# Patient Record
Sex: Male | Born: 1955 | Race: White | Hispanic: No | Marital: Single | Smoking: Current every day smoker
Health system: Southern US, Community
[De-identification: ages and names within clinical notes are randomized; demographics above are authoritative.]

## PROBLEM LIST (undated history)

## (undated) HISTORY — PX: FRACTURE SURGERY: SHX138

## (undated) HISTORY — PX: SKIN CANCER EXCISION: SHX779

---

## 1999-12-03 ENCOUNTER — Encounter: Payer: Self-pay | Admitting: Emergency Medicine

## 1999-12-03 ENCOUNTER — Emergency Department (HOSPITAL_COMMUNITY): Admission: EM | Admit: 1999-12-03 | Discharge: 1999-12-03 | Payer: Self-pay | Admitting: *Deleted

## 2000-03-24 ENCOUNTER — Ambulatory Visit (HOSPITAL_BASED_OUTPATIENT_CLINIC_OR_DEPARTMENT_OTHER): Admission: RE | Admit: 2000-03-24 | Discharge: 2000-03-24 | Payer: Self-pay | Admitting: Surgery

## 2003-01-14 ENCOUNTER — Emergency Department (HOSPITAL_COMMUNITY): Admission: EM | Admit: 2003-01-14 | Discharge: 2003-01-14 | Payer: Self-pay | Admitting: Emergency Medicine

## 2004-08-01 ENCOUNTER — Ambulatory Visit (HOSPITAL_COMMUNITY): Admission: RE | Admit: 2004-08-01 | Discharge: 2004-08-01 | Payer: Self-pay | Admitting: Gastroenterology

## 2004-08-31 ENCOUNTER — Emergency Department (HOSPITAL_COMMUNITY): Admission: EM | Admit: 2004-08-31 | Discharge: 2004-09-01 | Payer: Self-pay | Admitting: Emergency Medicine

## 2008-09-25 ENCOUNTER — Emergency Department (HOSPITAL_COMMUNITY): Admission: EM | Admit: 2008-09-25 | Discharge: 2008-09-25 | Payer: Self-pay | Admitting: *Deleted

## 2008-10-09 ENCOUNTER — Emergency Department (HOSPITAL_COMMUNITY): Admission: EM | Admit: 2008-10-09 | Discharge: 2008-10-09 | Payer: Self-pay | Admitting: Emergency Medicine

## 2010-03-02 IMAGING — CR DG SHOULDER 2+V PORT*R*
2 series · 2 of 2 positions shown · non-contrast
Comparison: Prereduction views 10/09/2008.

CLINICAL DATA: Post reduction shoulder dislocation.

PORTABLE RIGHT SHOULDER - 2+ VIEW

[view not recorded (1 of 2)]
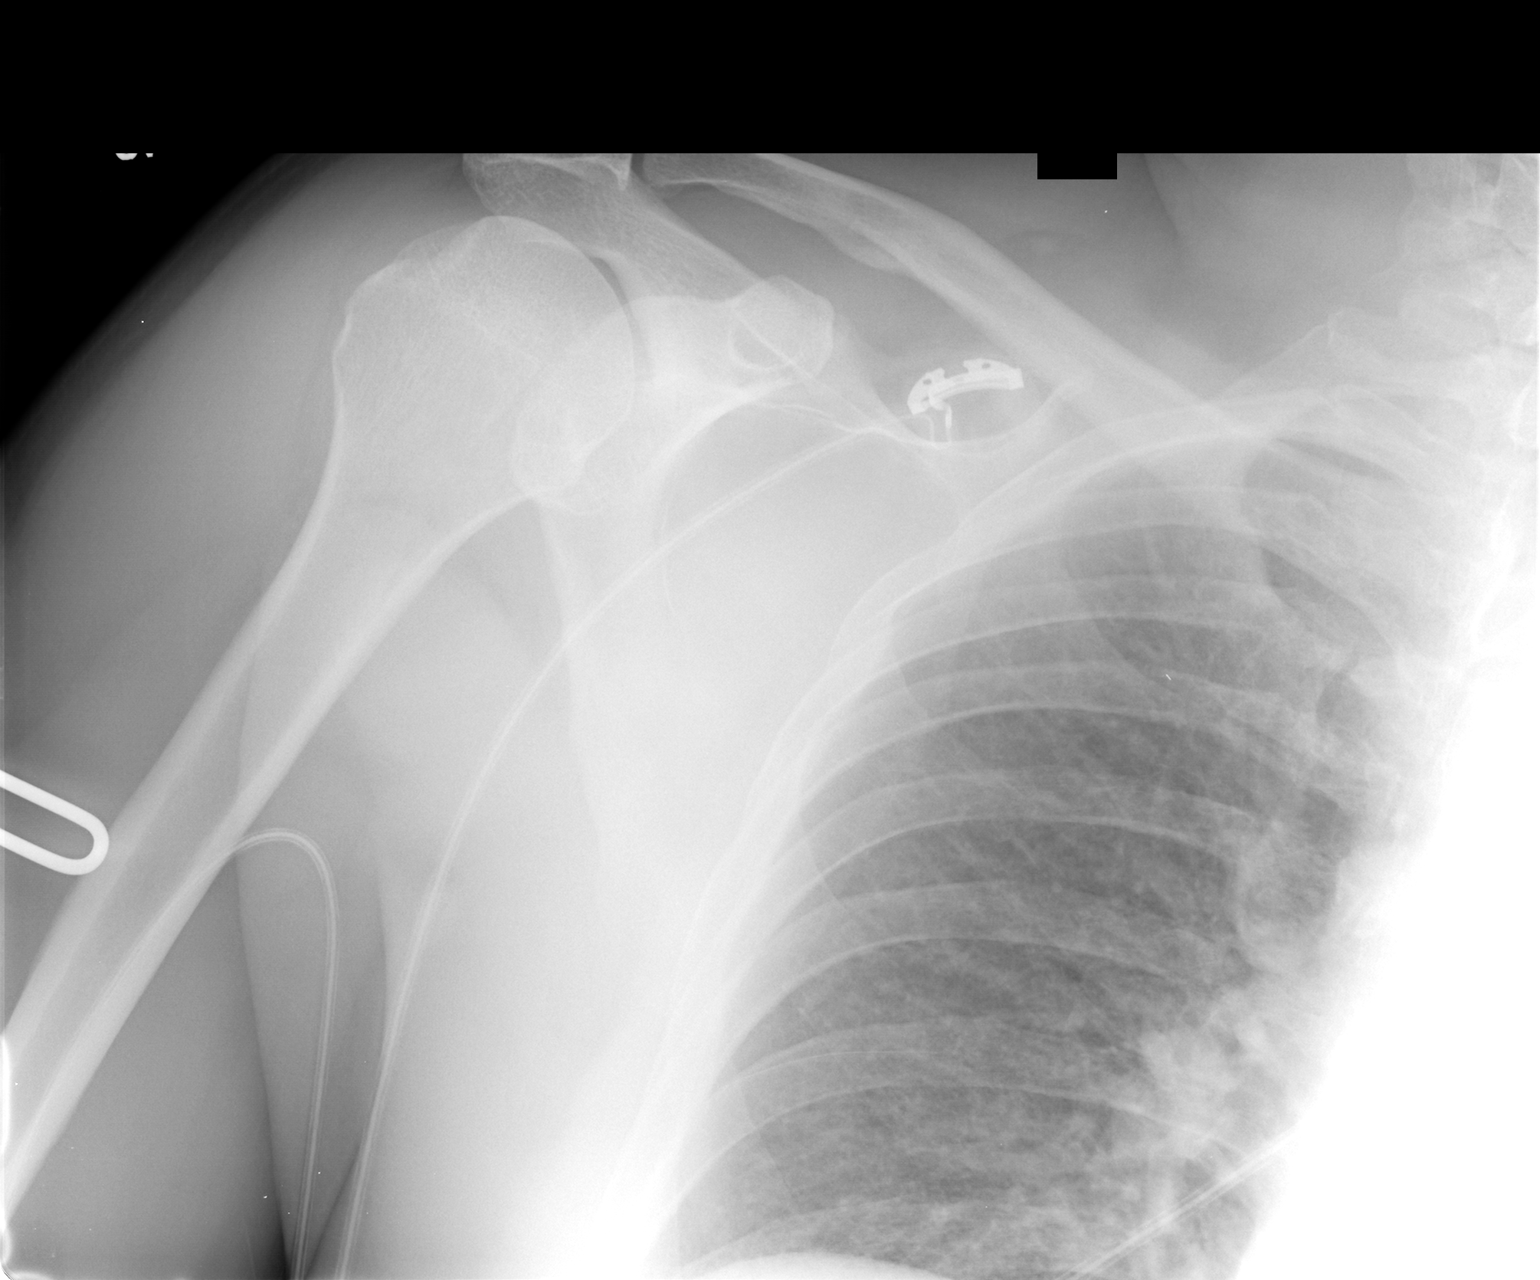

[view not recorded (2 of 2)]
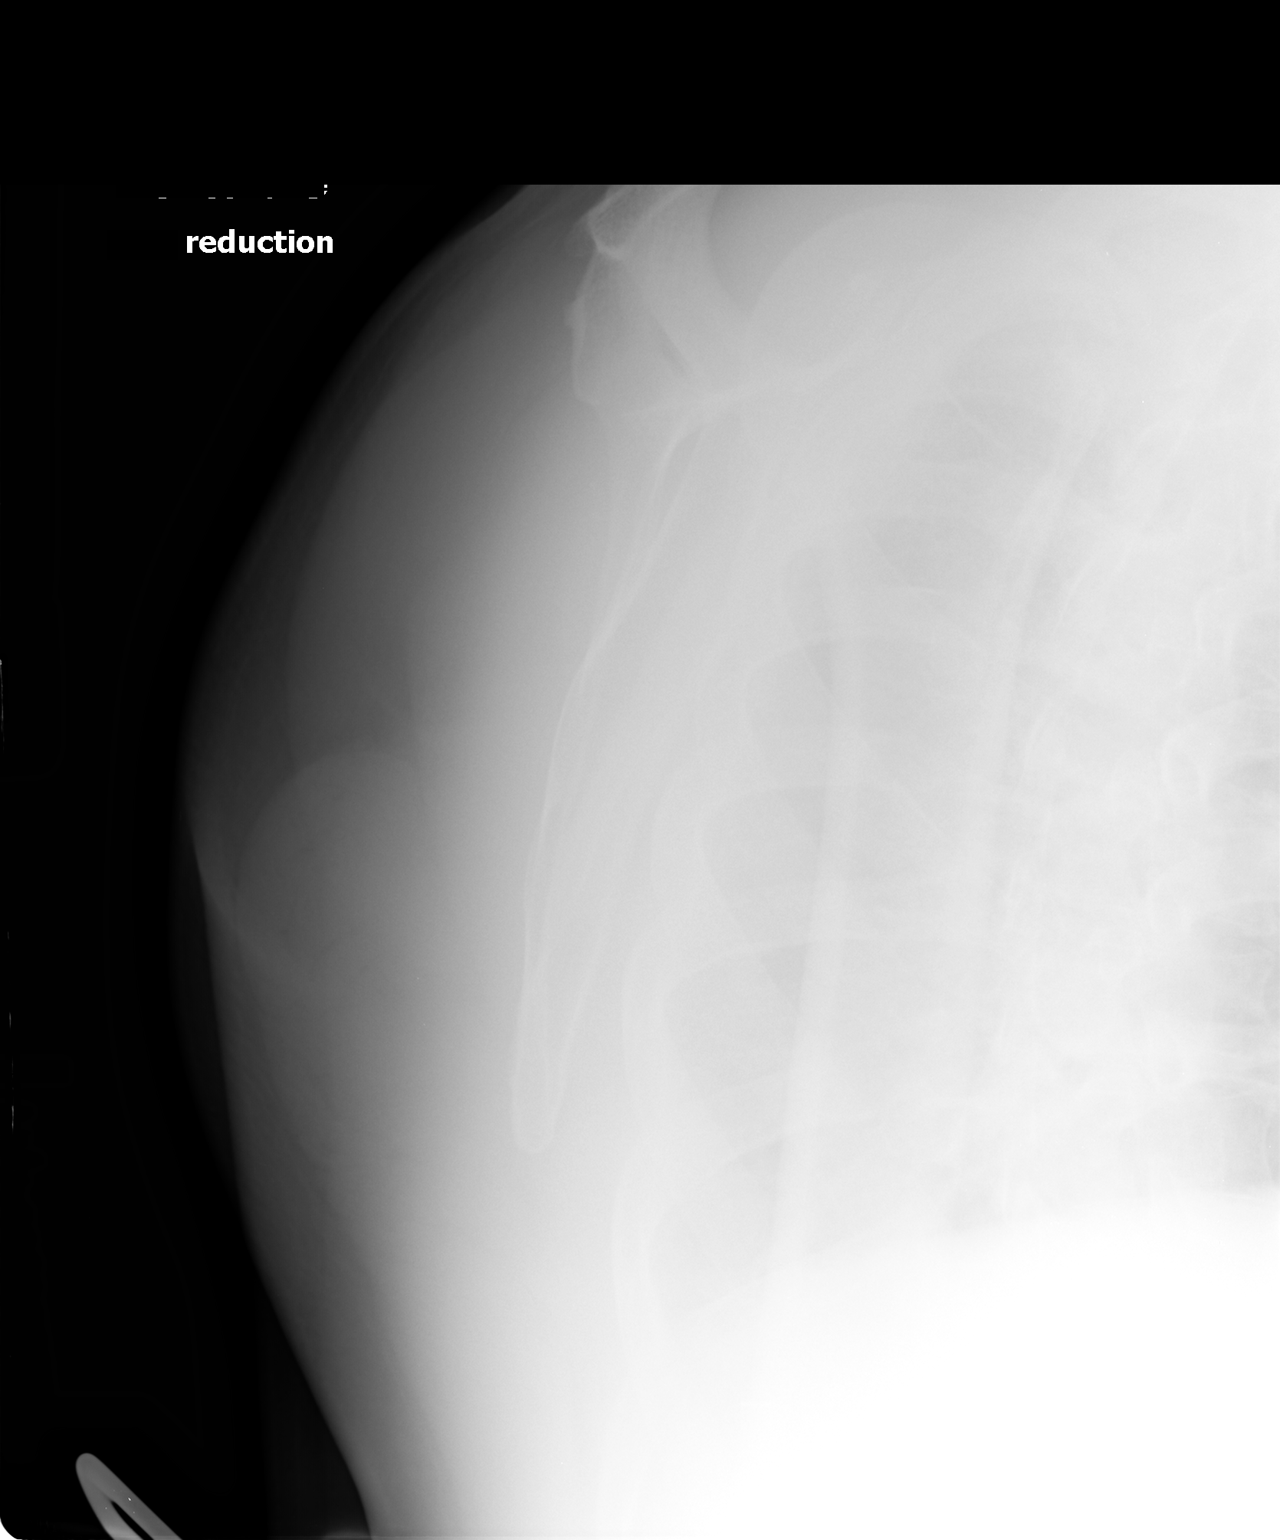

[2 of 2 positions shown; findings below may reference images not displayed]

FINDINGS: Portable Y view is under penetrated.  Reduction right
humeral head.  Impaction fracture lateral aspect humeral head.
Moderate acromioclavicular joint degenerative changes.  Visualized
lungs clear.
IMPRESSION: Reduction right humeral head.  Result of impaction fracture lateral
aspect of the right normal head is noted.

Moderate acromioclavicular joint degenerative changes.

## 2010-03-02 IMAGING — CR DG SHOULDER 2+V*R*
2 series · 2 of 2 positions shown · non-contrast
Comparison: 09/25/2008

CLINICAL DATA: Right shoulder dislocation

RIGHT SHOULDER - 2+ VIEW

[w shoulder ap internal righ]
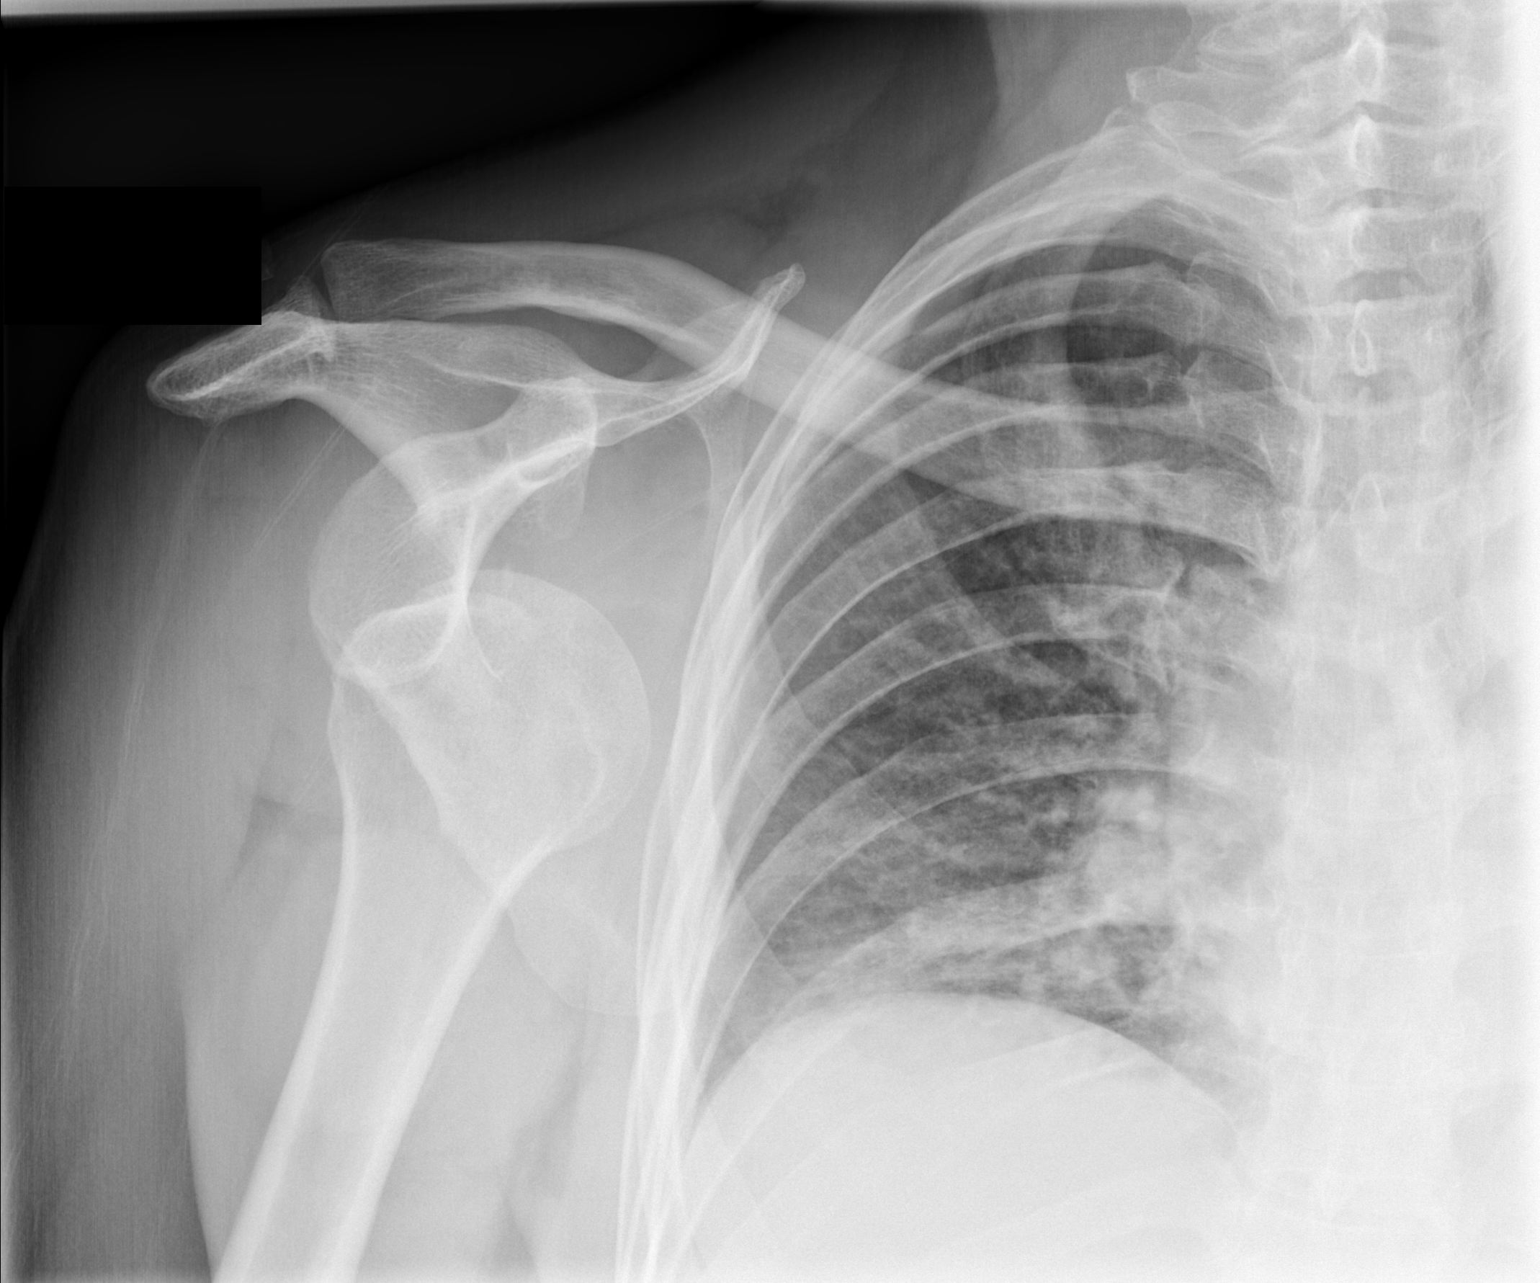

[w shoulder ap external righ]
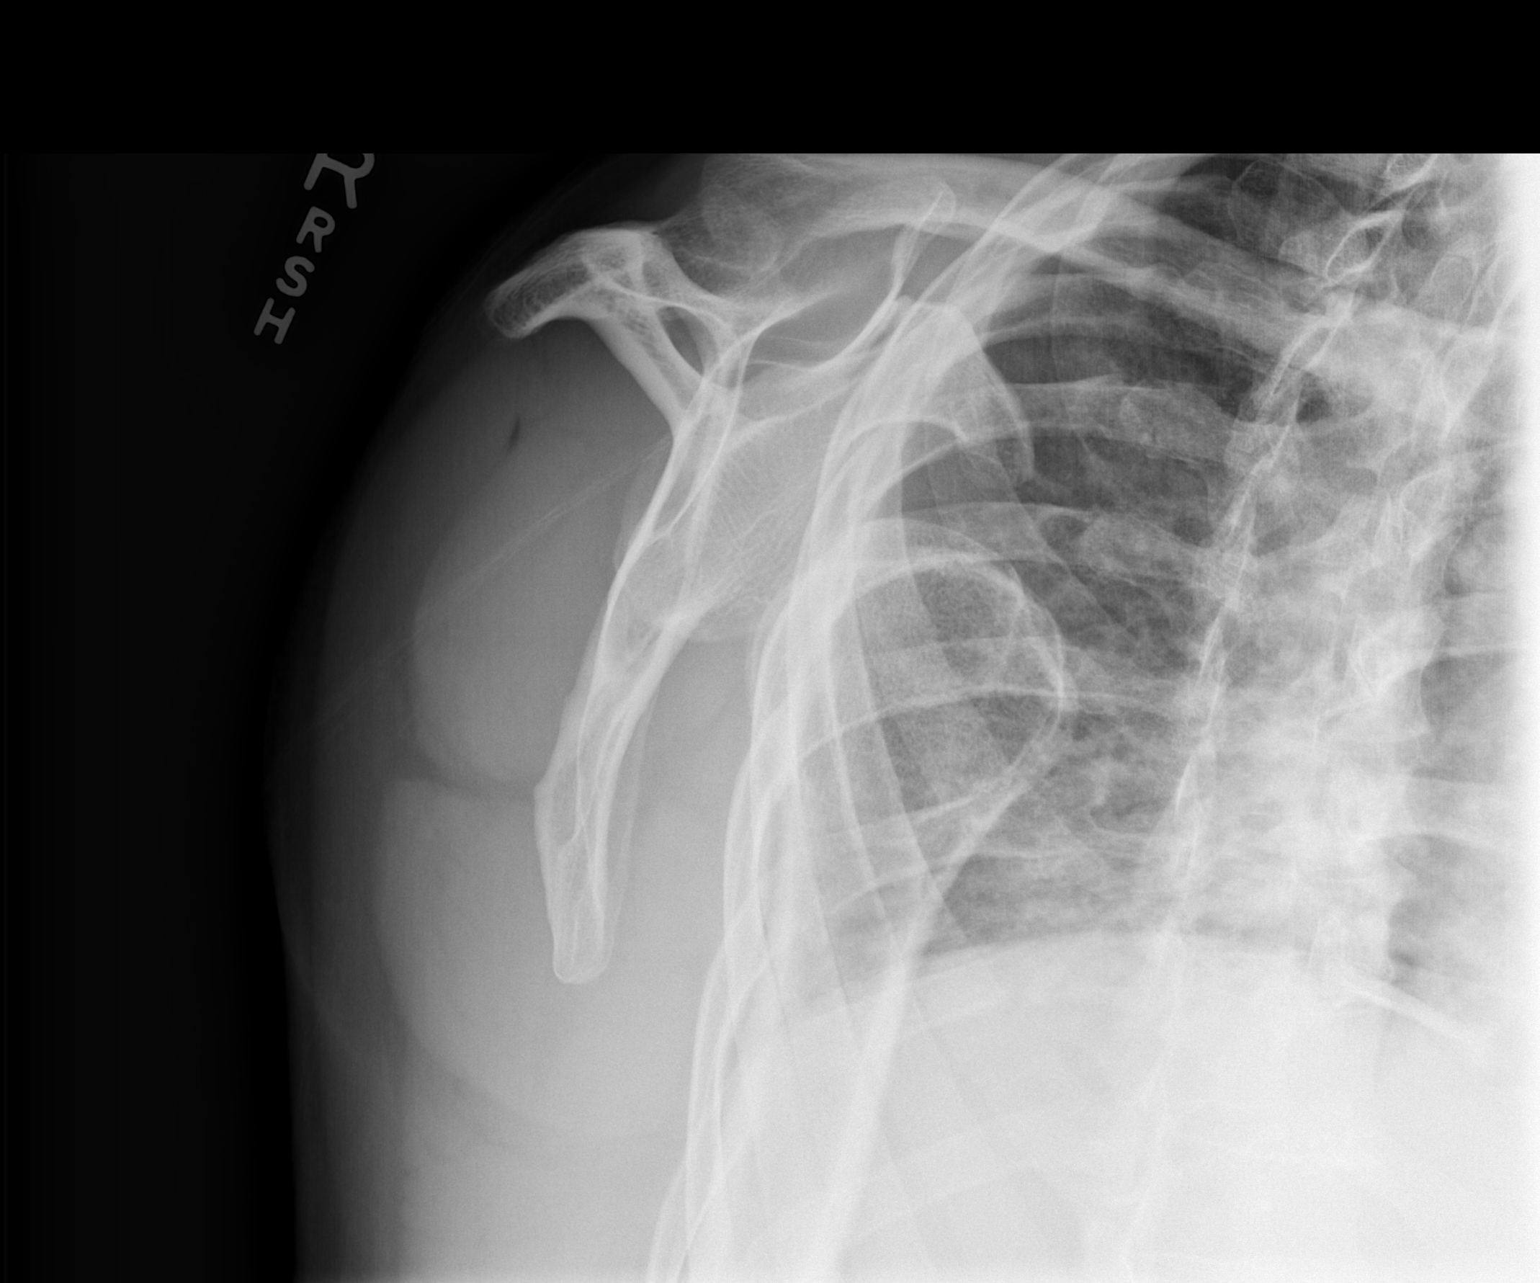

[2 of 2 positions shown; findings below may reference images not displayed]

FINDINGS: There has been an anterior dislocation of the right
shoulder.

No fracture is identified.

No radio-opaque foreign bodies or soft tissue calcifications.

Mild degenerative change affects the acromioclavicular joint.
IMPRESSION: 1.  Anterior shoulder dislocation.

## 2013-02-16 ENCOUNTER — Other Ambulatory Visit: Payer: Self-pay | Admitting: Dermatology

## 2013-11-29 ENCOUNTER — Other Ambulatory Visit: Payer: Self-pay | Admitting: Dermatology

## 2014-06-27 ENCOUNTER — Observation Stay (HOSPITAL_COMMUNITY): Payer: PRIVATE HEALTH INSURANCE

## 2014-06-27 ENCOUNTER — Observation Stay (HOSPITAL_COMMUNITY)
Admission: EM | Admit: 2014-06-27 | Discharge: 2014-06-29 | Disposition: A | Discharge status: Home / Self Care | Payer: PRIVATE HEALTH INSURANCE | Attending: Internal Medicine | Admitting: Internal Medicine

## 2014-06-27 ENCOUNTER — Encounter (HOSPITAL_COMMUNITY): Payer: Self-pay | Admitting: Emergency Medicine

## 2014-06-27 ENCOUNTER — Emergency Department (HOSPITAL_COMMUNITY): Payer: PRIVATE HEALTH INSURANCE

## 2014-06-27 DIAGNOSIS — G459 Transient cerebral ischemic attack, unspecified: Secondary | ICD-10-CM | POA: Diagnosis present

## 2014-06-27 DIAGNOSIS — R202 Paresthesia of skin: Secondary | ICD-10-CM

## 2014-06-27 DIAGNOSIS — R531 Weakness: Secondary | ICD-10-CM | POA: Diagnosis present

## 2014-06-27 DIAGNOSIS — N183 Chronic kidney disease, stage 3 unspecified: Secondary | ICD-10-CM | POA: Diagnosis present

## 2014-06-27 DIAGNOSIS — I739 Peripheral vascular disease, unspecified: Secondary | ICD-10-CM | POA: Insufficient documentation

## 2014-06-27 DIAGNOSIS — F17210 Nicotine dependence, cigarettes, uncomplicated: Secondary | ICD-10-CM | POA: Insufficient documentation

## 2014-06-27 DIAGNOSIS — I639 Cerebral infarction, unspecified: Secondary | ICD-10-CM | POA: Diagnosis not present

## 2014-06-27 DIAGNOSIS — Z7982 Long term (current) use of aspirin: Secondary | ICD-10-CM | POA: Diagnosis not present

## 2014-06-27 DIAGNOSIS — E785 Hyperlipidemia, unspecified: Secondary | ICD-10-CM | POA: Diagnosis present

## 2014-06-27 DIAGNOSIS — I672 Cerebral atherosclerosis: Secondary | ICD-10-CM | POA: Diagnosis not present

## 2014-06-27 DIAGNOSIS — Z85828 Personal history of other malignant neoplasm of skin: Secondary | ICD-10-CM | POA: Diagnosis not present

## 2014-06-27 DIAGNOSIS — Z789 Other specified health status: Secondary | ICD-10-CM | POA: Diagnosis present

## 2014-06-27 DIAGNOSIS — Z720 Tobacco use: Secondary | ICD-10-CM | POA: Diagnosis present

## 2014-06-27 DIAGNOSIS — I519 Heart disease, unspecified: Secondary | ICD-10-CM | POA: Diagnosis present

## 2014-06-27 DIAGNOSIS — F1099 Alcohol use, unspecified with unspecified alcohol-induced disorder: Secondary | ICD-10-CM

## 2014-06-27 DIAGNOSIS — Z7289 Other problems related to lifestyle: Secondary | ICD-10-CM | POA: Diagnosis present

## 2014-06-27 DIAGNOSIS — R200 Anesthesia of skin: Secondary | ICD-10-CM | POA: Diagnosis present

## 2014-06-27 LAB — COMPREHENSIVE METABOLIC PANEL
ALBUMIN: 4.1 g/dL (ref 3.5–5.2)
ALK PHOS: 71 U/L (ref 39–117)
ALT: 13 U/L (ref 0–53)
ANION GAP: 9 (ref 5–15)
AST: 15 U/L (ref 0–37)
BILIRUBIN TOTAL: 0.6 mg/dL (ref 0.3–1.2)
BUN: 15 mg/dL (ref 6–23)
CALCIUM: 8.8 mg/dL (ref 8.4–10.5)
CHLORIDE: 105 mmol/L (ref 96–112)
CO2: 24 mmol/L (ref 19–32)
CREATININE: 1.33 mg/dL (ref 0.50–1.35)
GFR, EST AFRICAN AMERICAN: 66 mL/min — AB (ref >90)
GFR, EST NON AFRICAN AMERICAN: 57 mL/min — AB (ref >90)
Glucose, Bld: 120 mg/dL — ABNORMAL HIGH (ref 70–99)
POTASSIUM: 3.8 mmol/L (ref 3.5–5.1)
Sodium: 138 mmol/L (ref 135–145)
Total Protein: 7.4 g/dL (ref 6.0–8.3)

## 2014-06-27 LAB — DIFFERENTIAL
Basophils Absolute: 0.1 10*3/uL (ref 0.0–0.1)
Basophils Relative: 1 % (ref 0–1)
EOS ABS: 0.1 10*3/uL (ref 0.0–0.7)
Eosinophils Relative: 2 % (ref 0–5)
LYMPHS ABS: 2.2 10*3/uL (ref 0.7–4.0)
LYMPHS PCT: 26 % (ref 12–46)
MONO ABS: 0.7 10*3/uL (ref 0.1–1.0)
Monocytes Relative: 9 % (ref 3–12)
NEUTROS PCT: 63 % (ref 43–77)
Neutro Abs: 5.2 10*3/uL (ref 1.7–7.7)

## 2014-06-27 LAB — I-STAT TROPONIN, ED: TROPONIN I, POC: 0 ng/mL (ref 0.00–0.08)

## 2014-06-27 LAB — I-STAT CHEM 8, ED
BUN: 15 mg/dL (ref 6–23)
CALCIUM ION: 1.05 mmol/L — AB (ref 1.13–1.30)
CREATININE: 1.3 mg/dL (ref 0.50–1.35)
Chloride: 105 mmol/L (ref 96–112)
GLUCOSE: 120 mg/dL — AB (ref 70–99)
HEMATOCRIT: 47 % (ref 39.0–52.0)
Hemoglobin: 16 g/dL (ref 13.0–17.0)
POTASSIUM: 3.8 mmol/L (ref 3.5–5.1)
SODIUM: 140 mmol/L (ref 135–145)
TCO2: 23 mmol/L (ref 0–100)

## 2014-06-27 LAB — PROTIME-INR
INR: 0.99 (ref 0.00–1.49)
Prothrombin Time: 13.2 seconds (ref 11.6–15.2)

## 2014-06-27 LAB — CBC
HCT: 42.3 % (ref 39.0–52.0)
Hemoglobin: 14.7 g/dL (ref 13.0–17.0)
MCH: 33.1 pg (ref 26.0–34.0)
MCHC: 34.8 g/dL (ref 30.0–36.0)
MCV: 95.3 fL (ref 78.0–100.0)
PLATELETS: 293 10*3/uL (ref 150–400)
RBC: 4.44 MIL/uL (ref 4.22–5.81)
RDW: 13.3 % (ref 11.5–15.5)
WBC: 8.3 10*3/uL (ref 4.0–10.5)

## 2014-06-27 LAB — CBG MONITORING, ED
Glucose-Capillary: 128 mg/dL — ABNORMAL HIGH (ref 70–99)
Glucose-Capillary: 105 mg/dL — ABNORMAL HIGH (ref 70–99)

## 2014-06-27 LAB — APTT: APTT: 37 s (ref 24–37)

## 2014-06-27 MED ORDER — ENOXAPARIN SODIUM 40 MG/0.4ML ~~LOC~~ SOLN
40.0000 mg | SUBCUTANEOUS | Status: DC
  Filled 2014-06-27 (×2): qty 0.4

## 2014-06-27 MED ORDER — ACETAMINOPHEN 650 MG RE SUPP: 650.0000 mg | RECTAL | Status: DC | PRN

## 2014-06-27 MED ORDER — ZOLPIDEM TARTRATE 5 MG PO TABS
5.0000 mg | ORAL_TABLET | Freq: Once | ORAL | Status: AC
  Administered 2014-06-27: 5 mg via ORAL
  Filled 2014-06-27: qty 1

## 2014-06-27 MED ORDER — ACETAMINOPHEN 325 MG PO TABS: 650.0000 mg | ORAL_TABLET | ORAL | Status: DC | PRN

## 2014-06-27 MED ORDER — STROKE: EARLY STAGES OF RECOVERY BOOK
Freq: Once | Status: DC
  Filled 2014-06-27: qty 1

## 2014-06-27 MED ORDER — ASPIRIN 81 MG PO CHEW
81.0000 mg | CHEWABLE_TABLET | Freq: Once | ORAL | Status: AC
  Administered 2014-06-27: 81 mg via ORAL
  Filled 2014-06-27: qty 1

## 2014-06-27 MED ORDER — SENNOSIDES-DOCUSATE SODIUM 8.6-50 MG PO TABS: 1.0000 | ORAL_TABLET | Freq: Every evening | ORAL | Status: DC | PRN

## 2014-06-27 MED ORDER — ASPIRIN 325 MG PO TABS
325.0000 mg | ORAL_TABLET | Freq: Every day | ORAL | Status: DC
  Administered 2014-06-28 – 2014-06-29 (×2): 325 mg via ORAL
  Filled 2014-06-27 (×2): qty 1

## 2014-06-27 NOTE — ED Provider Notes (Signed)
CSN: [id number]     Arrival date & time 06/27/14  1412 History   First MD Initiated Contact with Patient 06/27/14 1427     Chief Complaint  Patient presents with  . Code Stroke      HPI Pt was seen at 1425.  Per pt, c/o sudden onset and persistence of constant LUE weakness that began today approximately 1 hour PTA (1330/1345). Pt states he was sitting at work looking out a window when he noticed his vision was "blurry." States the then noted his left hand to be "numb" and his LUE feel "weaker" than his right. Denies slurred speech, no facial droop, no ataxia, no headache, no neck pain, no syncope/near syncope, no CP/SOB.    History reviewed. No pertinent past medical history.   Past Surgical History  Procedure Laterality Date  . Fracture surgery Left     Forearm  . Skin cancer excision N/A     Mid forehead   History reviewed. No pertinent family history. History  Substance Use Topics  . Smoking status: Current Every Day Smoker -- 0.75 packs/day    Types: Cigarettes  . Smokeless tobacco: Not on file  . Alcohol Use: 3.6 oz/week    6 Cans of beer per week    Review of Systems ROS: Statement: All systems negative except as marked or noted in the HPI; Constitutional: Negative for fever and chills. ; ; Eyes: +"blurry vision." Negative for eye pain, redness and discharge. ; ; ENMT: Negative for ear pain, hoarseness, nasal congestion, sinus pressure and sore throat. ; ; Cardiovascular: Negative for chest pain, palpitations, diaphoresis, dyspnea and peripheral edema. ; ; Respiratory: Negative for cough, wheezing and stridor. ; ; Gastrointestinal: Negative for nausea, vomiting, diarrhea, abdominal pain, blood in stool, hematemesis, jaundice and rectal bleeding. . ; ; Genitourinary: Negative for dysuria, flank pain and hematuria. ; ; Musculoskeletal: Negative for back pain and neck pain. Negative for swelling and trauma.; ; Skin: Negative for pruritus, rash, abrasions, blisters, bruising and  skin lesion.; ; Neuro: +extremity weakness, paresthesia. Negative for headache, lightheadedness and neck stiffness. Negative for weakness, altered level of consciousness , altered mental status, involuntary movement, seizure and syncope.      Allergies  Review of patient's allergies indicates no known allergies.  Home Medications   Prior to Admission medications   Medication Sig Start Date End Date Taking? Authorizing Provider  Esomeprazole Magnesium (NEXIUM PO) Take 1 capsule by mouth daily as needed (heartburn).   Yes Historical Provider, MD   BP 131/83 mmHg  Pulse 71  Temp(Src) 98.5 F (36.9 C) (Oral)  Resp 20  SpO2 97% Physical Exam  1425: Physical examination:  Nursing notes reviewed; Vital signs and O2 SAT reviewed;  Constitutional: Well developed, Well nourished, Well hydrated, In no acute distress; Head:  Normocephalic, atraumatic; Eyes: EOMI, PERRL, No scleral icterus; ENMT: Mouth and pharynx normal, Mucous membranes moist; Neck: Supple, Full range of motion, No lymphadenopathy; Cardiovascular: Regular rate and rhythm, No gallop; Respiratory: Breath sounds clear & equal bilaterally, No wheezes.  Speaking full sentences with ease, Normal respiratory effort/excursion; Chest: Nontender, Movement normal; Abdomen: Soft, Nontender, Nondistended, Normal bowel sounds; Genitourinary: No CVA tenderness; Extremities: Pulses normal, No tenderness, No edema, No calf edema or asymmetry.; Neuro: AA&Ox3, Major CN grossly intact. Speech clear.  No facial droop.  No nystagmus. No gross visual field cuts. Left grip weaker than right. Strength 5/5 RUE and bilat LE's; strength RUE 4/5.  DTR 2/4 equal bilat UE's and LE's.  +subjective  decreased sensation left face, LUE and LLE, otherwise no gross sensory deficits.  Normal cerebellar testing bilat UE's (finger-nose) and LE's (heel-shin)..; Skin: Color normal, Warm, Dry.   ED Course  Procedures     EKG Interpretation   Date/Time:  Wednesday June 27 2014 14:21:04 EST Ventricular Rate:  75 PR Interval:  165 QRS Duration: 67 QT Interval:  363 QTC Calculation: 405 R Axis:   30 Text Interpretation:  Sinus rhythm Probable left atrial enlargement  Baseline wander No old tracing to compare Confirmed by [facility]  MD,  Nicholos Johns [phone]) on 06/27/2014 3:55:48 PM      MDM  MDM Reviewed: previous chart, nursing note and vitals Reviewed previous: labs Interpretation: labs, ECG, x-ray and CT scan Total time providing critical care: 30-74 minutes. This excludes time spent performing separately reportable procedures and services. Consults: neurology and admitting MD     CRITICAL CARE Performed by: Laray Anger Total critical care time: 35 Critical care time was exclusive of separately billable procedures and treating other patients. Critical care was necessary to treat or prevent imminent or life-threatening deterioration. Critical care was time spent personally by me on the following activities: development of treatment plan with patient and/or surrogate as well as nursing, discussions with consultants, evaluation of patient's response to treatment, examination of patient, obtaining history from patient or surrogate, ordering and performing treatments and interventions, ordering and review of laboratory studies, ordering and review of radiographic studies, pulse oximetry and re-evaluation of patient's condition.   Results for orders placed or performed during the hospital encounter of 06/27/14  Protime-INR  Result Value Ref Range   Prothrombin Time 13.2 11.6 - 15.2 seconds   INR 0.99 0.00 - 1.49  APTT  Result Value Ref Range   aPTT 37 24 - 37 seconds  CBC  Result Value Ref Range   WBC 8.3 4.0 - 10.5 K/uL   RBC 4.44 4.22 - 5.81 MIL/uL   Hemoglobin 14.7 13.0 - 17.0 g/dL   HCT 65.7 84.6 - 96.2 %   MCV 95.3 78.0 - 100.0 fL   MCH 33.1 26.0 - 34.0 pg   MCHC 34.8 30.0 - 36.0 g/dL   RDW 95.2 84.1 - 32.4 %   Platelets 293 150 -  400 K/uL  Differential  Result Value Ref Range   Neutrophils Relative % 63 43 - 77 %   Neutro Abs 5.2 1.7 - 7.7 K/uL   Lymphocytes Relative 26 12 - 46 %   Lymphs Abs 2.2 0.7 - 4.0 K/uL   Monocytes Relative 9 3 - 12 %   Monocytes Absolute 0.7 0.1 - 1.0 K/uL   Eosinophils Relative 2 0 - 5 %   Eosinophils Absolute 0.1 0.0 - 0.7 K/uL   Basophils Relative 1 0 - 1 %   Basophils Absolute 0.1 0.0 - 0.1 K/uL  Comprehensive metabolic panel  Result Value Ref Range   Sodium 138 135 - 145 mmol/L   Potassium 3.8 3.5 - 5.1 mmol/L   Chloride 105 96 - 112 mmol/L   CO2 24 19 - 32 mmol/L   Glucose, Bld 120 (H) 70 - 99 mg/dL   BUN 15 6 - 23 mg/dL   Creatinine, Ser 4.01 0.50 - 1.35 mg/dL   Calcium 8.8 8.4 - 02.7 mg/dL   Total Protein 7.4 6.0 - 8.3 g/dL   Albumin 4.1 3.5 - 5.2 g/dL   AST 15 0 - 37 U/L   ALT 13 0 - 53 U/L   Alkaline Phosphatase 71  39 - 117 U/L   Total Bilirubin 0.6 0.3 - 1.2 mg/dL   GFR calc non Af Amer 57 (L) >90 mL/min   GFR calc Af Amer 66 (L) >90 mL/min   Anion gap 9 5 - 15  CBG monitoring, ED  Result Value Ref Range   Glucose-Capillary 128 (H) 70 - 99 mg/dL  I-stat troponin, ED ([facility])  Result Value Ref Range   Troponin i, poc 0.00 0.00 - 0.08 ng/mL   Comment 3          I-stat Chem 8, ED  Result Value Ref Range   Sodium 140 135 - 145 mmol/L   Potassium 3.8 3.5 - 5.1 mmol/L   Chloride 105 96 - 112 mmol/L   BUN 15 6 - 23 mg/dL   Creatinine, Ser 1.611.30 0.50 - 1.35 mg/dL   Glucose, Bld 096120 (H) 70 - 99 mg/dL   Calcium, Ion 0.451.05 (L) 1.13 - 1.30 mmol/L   TCO2 23 0 - 100 mmol/L   Hemoglobin 16.0 13.0 - 17.0 g/dL   HCT 40.947.0 81.139.0 - 91.452.0 %   Ct Head (brain) Wo Contrast 06/27/2014   CLINICAL DATA:  Left arm numbness, frontal headache, blurred position  EXAM: CT HEAD WITHOUT CONTRAST  TECHNIQUE: Contiguous axial images were obtained from the base of the skull through the vertex without intravenous contrast.  COMPARISON:  None.  FINDINGS: No skull fracture is noted. There is  mucosal thickening bilateral maxillary sinus. The mastoid air cells are unremarkable. Mild mucosal thickening in posterior aspect of left sphenoid sinus.  No intracranial hemorrhage, mass effect or midline shift  No acute infarction. No mass lesion is noted on this unenhanced scan no hydrocephalus.  IMPRESSION: No acute intracranial abnormality. Mucosal thickening bilateral maxillary sinus.   Electronically Signed   By: Natasha MeadLiviu  Pop M.D.   On: 06/27/2014 14:56    1440:  Code Stroke called on pt's arrival to exam room. T/C to Neurology Dr. Amada JupiterKirkpatrick, case discussed, including:  HPI, pertinent PM/SHx, VS/PE, dx testing, ED course and treatment:  States Dr. Roseanne RenoStewart will come to ED for evaluation.  1500:  Neuro Dr. Roseanne RenoStewart has evaluated pt: believes pt has had a small acute stroke with mild deficits, he is not a TPA candidate, requests to dose ASA and [facility].   1600:  No change in assessment. [facility]. Waymon AmatoHongalgi, case discussed, including:  HPI, pertinent PM/SHx, VS/PE, dx testing, ED course and treatment:  [facility], requests to write temporary orders, obtain MRI brain and observation tele bed to team MC[facility]mits.   Samuel JesterKathleen Kalisha Keadle, DO 06/30/14 [phone]

## 2014-06-27 NOTE — ED Notes (Signed)
Per Victorino DikeJennifer CN neurologist at bedside.

## 2014-06-27 NOTE — Consult Note (Signed)
Referring Physician: [facility], [facility]    Chief Complaint: Acute onset of numbness and weakness involving left upper extremity.  HPI: AZARIAN STARACE is an 59 y.o. male with no documented previous medical history presenting with new onset numbness and distal weakness of his left upper extremity. He was last known well at 1345 today. He has not been on antiplatelet therapy. CT scan of his head showed no acute intracranial abnormality. Strength of his left upper extremity improved. Numbness has persisted. No changes in speech were noted. He had no weakness nor sensory changes involving left lower extremity. NIH stroke score was 2.  LSN: [id number] on 06/27/2014 tPA Given: No: Improving symptoms with minimal residual deficits mRankin:  History reviewed. No pertinent past medical history.  Family history; negative for stroke  Medications: I have reviewed the patient's current medications.  ROS: History obtained from the patient  General ROS: negative for - chills, fatigue, fever, night sweats, weight gain or weight loss Psychological ROS: negative for - behavioral disorder, hallucinations, memory difficulties, mood swings or suicidal ideation Ophthalmic ROS: negative for - blurry vision, double vision, eye pain or loss of vision ENT ROS: negative for - epistaxis, nasal discharge, oral lesions, sore throat, tinnitus or vertigo Allergy and Immunology ROS: negative for - hives or itchy/watery eyes Hematological and Lymphatic ROS: negative for - bleeding problems, bruising or swollen lymph nodes Endocrine ROS: negative for - galactorrhea, hair pattern changes, polydipsia/polyuria or temperature intolerance Respiratory ROS: negative for - cough, hemoptysis, shortness of breath or wheezing Cardiovascular ROS: negative for - chest pain, dyspnea on exertion, edema or irregular heartbeat Gastrointestinal ROS: negative for - abdominal pain, diarrhea, hematemesis, nausea/vomiting or stool  incontinence Genito-Urinary ROS: negative for - dysuria, hematuria, incontinence or urinary frequency/urgency Musculoskeletal ROS: negative for - joint swelling or muscular weakness Neurological ROS: as noted in HPI Dermatological ROS: negative for rash and skin lesion changes  Physical Examination: Blood pressure 125/76, pulse 72, temperature 98.5 F (36.9 C), temperature source Oral, resp. rate 20, SpO2 97 %. Appearance was that of late middle-aged man of medium build who was alert and in no acute distress. HEENT: Normal Neck: Supple with no tenderness and full range of motion Normal heart rate and rhythm; normal heart sounds. Extremities were normal in appearance with no edema and no discoloration. Neurologic Examination: Mental Status: Alert, oriented, thought content appropriate.  Speech fluent without evidence of aphasia. Able to follow commands without difficulty. Cranial Nerves: II-Visual fields were normal. III/IV/VI-Pupils were equal and reacted. Extraocular movements were full and conjugate.    V/VII-reduced perception of tactile sensation over left side of the face compared to the right; equivocal flattening of left nasolabial fold.[name] VIII-normal. X-normal speech. XII-midline tongue extension Motor: Normal strength throughout except for equivocal mild weakness of intrinsic muscles and hand grip on the left compared to the right. Sensory: Reduced perception tactile sensation over upper extremity compared to right upper extremity, distally greater than proximally; no lower extremity sensory abnormalities. Deep Tendon Reflexes: 2+ and symmetric. Plantars: Flexor bilaterally Cerebellar: Normal finger-to-nose testing. Carotid auscultation: Normal  Ct Head (brain) Wo Contrast  06/27/2014   CLINICAL DATA:  Left arm numbness, frontal headache, blurred position  EXAM: CT HEAD WITHOUT CONTRAST  TECHNIQUE: Contiguous axial images were obtained from the base of the skull through the  vertex without intravenous contrast.  COMPARISON:  None.  FINDINGS: No skull fracture is noted. There is mucosal thickening bilateral maxillary sinus. The mastoid air cells are unremarkable. Mild mucosal thickening in posterior  aspect of left sphenoid sinus.  No intracranial hemorrhage, mass effect or midline shift  No acute infarction. No mass lesion is noted on this unenhanced scan no hydrocephalus.  IMPRESSION: No acute intracranial abnormality. Mucosal thickening bilateral maxillary sinus.   Electronically Signed   By: Natasha MeadLiviu  Pop M.D.   On: 06/27/2014 14:56    Assessment: 59 y.o. 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  Stroke Risk Factors - none  Plan: 1. HgbA1c, fasting lipid panel 2. MRI, MRA  of the brain without contrast 3. PT consult, OT consult, Speech consult 4. Echocardiogram 5. Carotid dopplers 6. Prophylactic therapy-Antiplatelet med: Aspirin  7. Risk factor modification 8. Telemetry monitoring   C.R. Roseanne RenoStewart, MD [facility] [phone]  06/27/2014, 3:25 PM

## 2014-06-27 NOTE — ED Notes (Signed)
Pt return from MRI and denies ALL symptoms. Pt neuro assessment unremarkable at present time.

## 2014-06-27 NOTE — ED Notes (Addendum)
Pt reports sudden onset of left hand numbness and bilateral blurry vision at 1345 today.

## 2014-06-27 NOTE — ED Notes (Signed)
Pt transported to MRI. Will call [facility] with pt return.

## 2014-06-27 NOTE — ED Notes (Signed)
Report to [facility]-on way to get patient 

## 2014-06-27 NOTE — H&P (Addendum)
History and Physical  Christian Wilcox RUE:[id number] DOB: 1955/07/01 DOA: 06/27/2014  Referring physician: Dr. Samuel Jester PCP: No primary care provider on file. PCP at [facility] family practice-patient does not recollect name. Outpatient Specialists:  1. None  Chief Complaint: Left upper extremity weakness and numbness.  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 He was in his usual state of health until 1:45 PM on 06/27/13 when he was sitting in his office and noted sudden onset of left upper extremity weakness, left hand tingling and numbness. He stood up and noticed transient blurred vision which lasted less than a minute and resolved spontaneously. No reported headache or slurred speech. No diplopia. His colleagues did not notice any facial asymmetry. Since then he has had couple of episodes of transient blurred vision which resolved spontaneously. His left upper extremity symptoms have improved but persistent mildly. He denies any complaints in the left lower extremity. CT head negative for acute findings. Neurology has evaluated and recommend transferring to [facility] for further stroke workup and evaluation by stroke team in a.m. Hospitalist admission was requested.   Review of Systems: All systems reviewed and apart from history of presenting illness, are negative.  History reviewed. No pertinent past medical history. Past Surgical History  Procedure Laterality Date  . Fracture surgery Left     Forearm  . Skin cancer excision N/A     Mid forehead   Social History:  reports that he has been smoking Cigarettes.  He has been smoking about 0.75 packs per day. He does not have any smokeless tobacco history on file. He reports that he drinks  about 3.6 oz of alcohol per week. He reports that he does not use illicit drugs. 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 Independent of activities of daily living. States that he drinks about 6 beers per week and occasionally liquor. Denies drug abuse.  No Known Allergies  History reviewed. No pertinent family history. mother with history of memory loss. Father has history of diabetes. No family history of strokes or heart disease.  Prior to Admission medications   Medication Sig Start Date End Date Taking? Authorizing Provider  Esomeprazole Magnesium (NEXIUM PO) Take 1 capsule by mouth daily as needed (heartburn).   Yes Historical Provider, MD   Physical Exam: Filed Vitals:   06/27/14 1530 06/27/14 1548 06/27/14 1600 06/27/14 1633  BP: 119/75 131/83 136/78 119/88  Pulse: 73 71 70 63  Temp:    98.2 F (36.8 C)  TempSrc:    Oral  Resp: SpO2: 98% 97% 98% 97%   temperature: 98.64F.   General exam: Moderately built and nourished pleasant middle-aged male patient, lying comfortably supine on the gurney in no obvious distress.  Head, eyes and ENT: Nontraumatic and normocephalic. Pupils equally reacting to light and accommodation. Oral mucosa moist.  Neck: Supple. No JVD, carotid bruit or thyromegaly.  Lymphatics: No lymphadenopathy.  Respiratory system: Clear to auscultation. No increased work of breathing.  Cardiovascular system: S1 and S2 heard, RRR. No JVD, murmurs, gallops, clicks or pedal edema.  Gastrointestinal system: Abdomen is nondistended, soft and nontender. Normal bowel sounds heard. No organomegaly or masses appreciated.  Central nervous system: Alert and oriented. No cranial nerve deficits.  Extremities: Symmetric 5  x 5 power. Peripheral pulses symmetrically felt. Patchy mild sensory abnormality in left hand. No pronator drift.  Skin: No rashes or acute findings.  Musculoskeletal system: Negative exam.  Psychiatry: Pleasant and cooperative.   Labs on  Admission:  Basic Metabolic Panel:  Recent Labs Lab 06/27/14 1425 06/27/14 1453  NA 138 140  K 3.8 3.8  CL 105 105  CO2 24  --   GLUCOSE 120* 120*  BUN 15 15  CREATININE 1.33 1.30  CALCIUM 8.8  --    Liver Function Tests:  Recent Labs Lab 06/27/14 1425  AST 15  ALT 13  ALKPHOS 71  BILITOT 0.6  PROT 7.4  ALBUMIN 4.1   No results for input(s): LIPASE, AMYLASE in the last 168 hours. No results for input(s): AMMONIA in the last 168 hours. CBC:  Recent Labs Lab 06/27/14 1425 06/27/14 1453  WBC 8.3  --   NEUTROABS 5.2  --   HGB 14.7 16.0  HCT 42.3 47.0  MCV 95.3  --   PLT 293  --    Cardiac Enzymes: No results for input(s): CKTOTAL, CKMB, CKMBINDEX, TROPONINI in the last 168 hours.  BNP (last 3 results) No results for input(s): PROBNP in the last 8760 hours. CBG:  Recent Labs Lab 06/27/14 1424  GLUCAP 128*    Radiological Exams on Admission: Ct Head (brain) Wo Contrast  06/27/2014   CLINICAL DATA:  Left arm numbness, frontal headache, blurred position  EXAM: CT HEAD WITHOUT CONTRAST  TECHNIQUE: Contiguous axial images were obtained from the base of the skull through the vertex without intravenous contrast.  COMPARISON:  None.  FINDINGS: No skull fracture is noted. There is mucosal thickening bilateral maxillary sinus. The mastoid air cells are unremarkable. Mild mucosal thickening in posterior aspect of left sphenoid sinus.  No intracranial hemorrhage, mass effect or midline shift  No acute infarction. No mass lesion is noted on this unenhanced scan no hydrocephalus.  IMPRESSION: No acute intracranial abnormality. Mucosal thickening bilateral maxillary sinus.   Electronically Signed   By: Natasha MeadLiviu  Pop M.D.   On: 06/27/2014 14:56    EKG: Independently reviewed. Sinus rhythm without acute changes.  Assessment/Plan Principal Problem:   Stroke Active Problems:   Numbness and tingling in left upper extremity   Tobacco abuse   Alcohol use   CKD (chronic kidney  disease), stage III   1. Possible right brain stroke Vs TIA: Complete stroke workup including MRI/MRA brain, 2-D echo, carotid Dopplers, fasting lipids and hemoglobin A1c. Stroke risk factors: Tobacco abuse. Not on antithrombotics PTA. Now on aspirin 325 MG daily for secondary stroke prophylaxis. Tobacco cessation counseled. No TPA secondary to mild symptoms and rapid improvement. Neurology has consulted and recommended [facility]. 2. Tobacco abuse: Cessation counseled. 3. Alcohol use: Monitor CIWA. Advised moderation. 4. Possible Stage III chronic kidney disease: No prior labs to compare.     Code Status: Full  Family Communication: Discussed with patient's brother at bedside- after patient consented.  Disposition Plan: [facility]. DC home when medically ready.   Time spent: 60 minutes  Joya Willmott, MD, FACP, FHM. [facility] Pager [phone]  If 7PM-7AM, please contact night-coverage [email or web address] Password [email or web address] 06/27/2014, 4:41 PM

## 2014-06-27 NOTE — ED Notes (Addendum)
Pt [employer] [facility] is backed up 3-4 hours. EMS to be called to transport pt. Morrie Sheldonshley update and aware transport of neuro status.

## 2014-06-28 DIAGNOSIS — I639 Cerebral infarction, unspecified: Secondary | ICD-10-CM | POA: Insufficient documentation

## 2014-06-28 DIAGNOSIS — G459 Transient cerebral ischemic attack, unspecified: Secondary | ICD-10-CM

## 2014-06-28 DIAGNOSIS — E785 Hyperlipidemia, unspecified: Secondary | ICD-10-CM | POA: Diagnosis present

## 2014-06-28 LAB — LIPID PANEL
Cholesterol: 209 mg/dL — ABNORMAL HIGH (ref 0–200)
HDL: 30 mg/dL — AB (ref >39)
LDL Cholesterol: 129 mg/dL — ABNORMAL HIGH (ref 0–99)
TRIGLYCERIDES: 252 mg/dL — AB (ref <150)
Total CHOL/HDL Ratio: 7 RATIO
VLDL: 50 mg/dL — AB (ref 0–40)

## 2014-06-28 LAB — URINALYSIS, ROUTINE W REFLEX MICROSCOPIC
Bilirubin Urine: NEGATIVE
Glucose, UA: NEGATIVE mg/dL
Hgb urine dipstick: NEGATIVE
KETONES UR: NEGATIVE mg/dL
Leukocytes, UA: NEGATIVE
Nitrite: NEGATIVE
PROTEIN: NEGATIVE mg/dL
SPECIFIC GRAVITY, URINE: 1.022 (ref 1.005–1.030)
Urobilinogen, UA: 0.2 mg/dL (ref 0.0–1.0)
pH: 5.5 (ref 5.0–8.0)

## 2014-06-28 LAB — TSH: TSH: 1.58 u[IU]/mL (ref 0.350–4.500)

## 2014-06-28 LAB — VITAMIN B12: VITAMIN B 12: 514 pg/mL (ref 211–911)

## 2014-06-28 LAB — GLUCOSE, CAPILLARY: Glucose-Capillary: 102 mg/dL — ABNORMAL HIGH (ref 70–99)

## 2014-06-28 MED ORDER — ZOLPIDEM TARTRATE 5 MG PO TABS
5.0000 mg | ORAL_TABLET | Freq: Once | ORAL | Status: AC
  Administered 2014-06-28: 5 mg via ORAL
  Filled 2014-06-28: qty 1

## 2014-06-28 NOTE — Progress Notes (Signed)
[facility] PROGRESS NOTE  Christian Wilcox ZOX:[id number] DOB: Jun 04, 1955 DOA: 06/27/2014 PCP: No primary care provider on file.  Summary I have seen and examined Christian Wilcox at bedside  In the presence of his girlfriend and reviewed his chart. Appreciate Neurology. 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 Workup has been significant for normal MRI brain/carotid Doppler. Lipid panel suggests hyperlipidemia which patient states has been borderline in the last few years and he is not willing to take medications for it at this point. Hemoglobin A1c is pending. Await 2-D echocardiogram. His blood pressure has been occasionally low in the 90s systolic, I wonder if this is anything to do with his presentation. Also requested TSH/vitamin B12 level/RPR/UA. Patient on aspirin. He refuses to take statin at the moment. Patient is a little frustrated as he expected things to move faster. Plan TIA (transient ischemic attack)/Numbness and tingling in left upper extremity/Hyperlipidemia  Appreciate neurology. Follow recommendations  Continue aspirin. Refuses anticholesterol medication  Follow 2-D echocardiogram/TSH/vitamin B12 level/RPR/UA Tobacco abuse/Alcohol use  Cessation counseling given  CKD (chronic kidney disease), stage III  No acute changes DVT prophylaxis  Patient refuses Lovenox   Code Status: Full code Family Communication: Spoke with girlfriend at bedside Disposition Plan: Home in the next 24-48 hours-awaits 2-D echo and lab results   Consultants:  Neurology  Procedures:  None  Antibiotics:  None  HPI/Subjective: No specific complaints.  Frustrated.  Objective: Filed Vitals:   06/28/14 1418  BP: 115/72  Pulse: 61  Temp: 97.7 F (36.5 C)  Resp: 14    Intake/Output Summary (Last 24 hours) at 06/28/14 1632 Last data filed at 06/28/14 0730  Gross per 24 hour  Intake      0 ml  Output    200 ml  Net   -200 ml   Filed Weights   06/27/14 1650  Weight: 85.276 kg (188 lb)    Exam:   General:  Ambulating. Not in distress.  Cardiovascular: RRR. S1-S2 normal. No murmurs.  Respiratory: Lungs clear  Abdomen: Soft nontender.  Musculoskeletal: No pedal edema.  Neurological- no focal deficits   Data Reviewed: Basic Metabolic Panel:  Recent Labs Lab 06/27/14 1425 06/27/14 1453  NA 138 140  K 3.8 3.8  CL 105 105  CO2 24  --   GLUCOSE 120* 120*  BUN 15 15  CREATININE 1.33 1.30  CALCIUM 8.8  --    Liver Function Tests:  Recent Labs Lab 06/27/14 1425  AST 15  ALT 13  ALKPHOS 71  BILITOT 0.6  PROT 7.4  ALBUMIN 4.1   No results for input(s): LIPASE, AMYLASE in the last 168 hours. No results for input(s): AMMONIA in the last 168 hours. CBC:  Recent Labs Lab 06/27/14 1425 06/27/14 1453  WBC 8.3  --   NEUTROABS 5.2  --   HGB 14.7 16.0  HCT 42.3 47.0  MCV 95.3  --   PLT 293  --    Cardiac Enzymes: No results for input(s): CKTOTAL, CKMB, CKMBINDEX, TROPONINI in the last 168 hours. BNP (last 3 results) No results for input(s): PROBNP in the last 8760 hours. CBG:  Recent Labs Lab 06/27/14 1424 06/27/14 1649 06/28/14 0653  GLUCAP 128*  105* 102*    No results found for this or any previous visit (from the past 240 hour(s)).   Studies: Dg Chest 2 View  06/27/2014   CLINICAL DATA:  Stroke, dizziness, left arm numbness  EXAM: CHEST  2 VIEW  COMPARISON:  08/31/2004  FINDINGS: Cardiomediastinal silhouette is stable. No acute infiltrate or pleural effusion. No pulmonary edema. Mild degenerative changes mid and lower thoracic spine.  IMPRESSION: No active cardiopulmonary disease.    Electronically Signed   By: Natasha MeadLiviu  Pop M.D.   On: 06/27/2014 16:54   Ct Head (brain) Wo Contrast  06/27/2014   CLINICAL DATA:  Left arm numbness, frontal headache, blurred position  EXAM: CT HEAD WITHOUT CONTRAST  TECHNIQUE: Contiguous axial images were obtained from the base of the skull through the vertex without intravenous contrast.  COMPARISON:  None.  FINDINGS: No skull fracture is noted. There is mucosal thickening bilateral maxillary sinus. The mastoid air cells are unremarkable. Mild mucosal thickening in posterior aspect of left sphenoid sinus.  No intracranial hemorrhage, mass effect or midline shift  No acute infarction. No mass lesion is noted on this unenhanced scan no hydrocephalus.  IMPRESSION: No acute intracranial abnormality. Mucosal thickening bilateral maxillary sinus.   Electronically Signed   By: Natasha MeadLiviu  Pop M.D.   On: 06/27/2014 14:56   Christian Brain Wo Contrast  06/27/2014   CLINICAL DATA:  Sudden onset of left upper extremity weakness, left hand tingling, and numbness. Transient episode of blurred vision.  EXAM: MRI HEAD WITHOUT CONTRAST  MRA HEAD WITHOUT CONTRAST  TECHNIQUE: Multiplanar, multiecho pulse sequences of the brain and surrounding structures were obtained without intravenous contrast. Angiographic images of the head were obtained using MRA technique without contrast.  COMPARISON:  CT head without contrast from the same day.  FINDINGS: MRI HEAD FINDINGS  The diffusion-weighted images demonstrate no evidence for acute or subacute infarction. Mild periventricular and subcortical white matter changes are slightly advanced for age. No acute hemorrhage or mass lesion is present. The ventricles are of normal size. No significant extraaxial fluid collection is present.  Flow is present in the major intracranial arteries. The globes and orbits are intact.  Fluid levels are present in the maxillary sinuses bilaterally with circumferential mucosal thickening. Mild mucosal thickening is  present throughout the ethmoid air cells and right frontal sinuses. The sphenoid sinuses and mastoid air cells are clear.  MRA HEAD FINDINGS  The internal carotid arteries are within normal limits from the high cervical segments through the ICA termini bilaterally. The A1 and M1 segments are normal. The anterior communicating artery is patent. Tip there is mild distal small vessel attenuation more prominent left than right. No significant proximal stenosis, aneurysm, or branch vessel occlusion is present.  The vertebral arteries are codominant. The basilar artery is within normal limits. The left posterior cerebral artery originates from the basilar tip. A left posterior communicating artery is present as well. The right posterior cerebral artery is of fetal type. PCA branch vessels are mildly attenuated on the left.  IMPRESSION: 1. Mild periventricular and subcortical white matter changes bilaterally are slightly advanced for age. This is nonspecific, but likely reflects the sequela of chronic microvascular ischemia. 2. Mild distal small vessel disease is evident on the MRA, more prominently in the left MCA and left PCA distributions. 3. No acute intracranial abnormality.   Electronically Signed   By: Gennette Pachris  Mattern M.D.   On: 06/27/2014 18:10   Christian Maxine GlennMra Head/brain Wo Cm  06/27/2014  CLINICAL DATA:  Sudden onset of left upper extremity weakness, left hand tingling, and numbness. Transient episode of blurred vision.  EXAM: MRI HEAD WITHOUT CONTRAST  MRA HEAD WITHOUT CONTRAST  TECHNIQUE: Multiplanar, multiecho pulse sequences of the brain and surrounding structures were obtained without intravenous contrast. Angiographic images of the head were obtained using MRA technique without contrast.  COMPARISON:  CT head without contrast from the same day.  FINDINGS: MRI HEAD FINDINGS  The diffusion-weighted images demonstrate no evidence for acute or subacute infarction. Mild periventricular and subcortical white matter  changes are slightly advanced for age. No acute hemorrhage or mass lesion is present. The ventricles are of normal size. No significant extraaxial fluid collection is present.  Flow is present in the major intracranial arteries. The globes and orbits are intact.  Fluid levels are present in the maxillary sinuses bilaterally with circumferential mucosal thickening. Mild mucosal thickening is present throughout the ethmoid air cells and right frontal sinuses. The sphenoid sinuses and mastoid air cells are clear.  MRA HEAD FINDINGS  The internal carotid arteries are within normal limits from the high cervical segments through the ICA termini bilaterally. The A1 and M1 segments are normal. The anterior communicating artery is patent. Tip there is mild distal small vessel attenuation more prominent left than right. No significant proximal stenosis, aneurysm, or branch vessel occlusion is present.  The vertebral arteries are codominant. The basilar artery is within normal limits. The left posterior cerebral artery originates from the basilar tip. A left posterior communicating artery is present as well. The right posterior cerebral artery is of fetal type. PCA branch vessels are mildly attenuated on the left.  IMPRESSION: 1. Mild periventricular and subcortical white matter changes bilaterally are slightly advanced for age. This is nonspecific, but likely reflects the sequela of chronic microvascular ischemia. 2. Mild distal small vessel disease is evident on the MRA, more prominently in the left MCA and left PCA distributions. 3. No acute intracranial abnormality.   Electronically Signed   By: Gennette Pac M.D.   On: 06/27/2014 18:10    Scheduled Meds: .  stroke: mapping our early stages of recovery book   Does not apply Once  . aspirin  325 mg Oral Daily  . enoxaparin (LOVENOX) injection  40 mg Subcutaneous Q24H   Continuous Infusions:   Principal Problem:   TIA (transient ischemic attack) Active  Problems:   Numbness and tingling in left upper extremity   Tobacco abuse   Alcohol use   CKD (chronic kidney disease), stage III   Hyperlipidemia    Time spent: 35 minutes    Lalonnie Shaffer  [facility] Pager [phone]. If 7PM-7AM, please contact night-coverage at [email or web address], password [facility] 06/28/2014, 4:32 PM  LOS: 1 day

## 2014-06-28 NOTE — Progress Notes (Signed)
Pt arrived to floor via care-link. Alert and oriented. Presenting with no current symptoms. Tele placed. VSS. Oriented pt to room and equipment. Expressed importance of calling for help for getting up. Will continue to monitor.

## 2014-06-28 NOTE — Progress Notes (Signed)
Patient refuses lovenox scheduled as VTE. MD paged

## 2014-06-28 NOTE — Progress Notes (Signed)
VASCULAR LAB PRELIMINARY  PRELIMINARY  PRELIMINARY  PRELIMINARY  Carotid duplex  completed.    Preliminary report:  Bilateral:  1-39% ICA stenosis.  Vertebral artery flow is antegrade.      Zackeriah Kissler, RVT 06/28/2014, 11:07 AM

## 2014-06-28 NOTE — Progress Notes (Signed)
Pt refused and educated on VTE prophylaxis. Lovenox was not given. Pt states he understands why it is prescribed and  the risks for not taking it.

## 2014-06-28 NOTE — Progress Notes (Signed)
OT Cancellation Note  Patient Details Name: Christian PicaStephen R Wilcox MRN: [id number] DOB: 1956-04-25   Cancelled Treatment:    Reason Eval/Treat Not Completed: OT screened, no needs identified, will sign off  Pilar GrammesMathews, Nayib Remer H 06/28/2014, 1:22 PM

## 2014-06-28 NOTE — Evaluation (Signed)
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 CT and MRI unremarkable. MRA-Mild distal small vessel disease is evident on the MRA, more prominently in the left MCA and left PCA distributions.     Clinical Impression  Patient functioning at baseline and does not require skilled therapy services. Pt tolerating high level balance challenges and able to negotiate steps without difficulty. Pt reports all symptoms have resolved. Education provided on signs/symptoms of stroke. Discharge from therapy.    Follow Up Recommendations No PT follow up    Equipment Recommendations  None recommended by PT    Recommendations for Other Services       Precautions / Restrictions Precautions Precautions: None Restrictions Weight Bearing Restrictions: No      Mobility  Bed Mobility               General bed mobility comments: Received sitting EOB upon PT arrival.   Transfers Overall transfer level: Independent Equipment used: None                Ambulation/Gait Ambulation/Gait assistance: Independent   Assistive device: None Gait Pattern/deviations: WFL(Within Functional Limits)   Gait velocity interpretation: at or above normal speed for age/gender General Gait Details: Pt with steady gait, tolerated higher level balance challenges without difficulty.   Stairs Stairs: Yes Stairs assistance: Independent Stair Management: Alternating pattern;No rails Number of Stairs: 11    Wheelchair Mobility    Modified Rankin (Stroke Patients Only) Modified Rankin (Stroke Patients Only) Pre-Morbid Rankin Score: No  symptoms Modified Rankin: No symptoms     Balance Overall balance assessment: Independent;Needs assistance                           High level balance activites: Backward walking;Direction changes;Turns;Sudden stops;Head turns High Level Balance Comments: Tolerated above high level balance activities w/out changes in gait or balance.              Pertinent Vitals/Pain Pain Assessment: No/denies pain    Home Living Family/patient expects to be discharged to:: Private residence Living Arrangements: Spouse/significant other Available Help at Discharge: Family Type of Home: House Home Access: Stairs to enter Entrance Stairs-Rails: Right Entrance Stairs-Number of Steps: 1 flight Home Layout: One level Home Equipment: None      Prior Function Level of Independence: Independent               Hand Dominance        Extremity/Trunk Assessment   Upper Extremity Assessment: Overall WFL for tasks assessed           Lower Extremity Assessment: Overall WFL for tasks assessed      Cervical / Trunk Assessment: Normal  Communication   Communication: No difficulties  Cognition Arousal/Alertness: Awake/alert Behavior During Therapy: WFL for tasks assessed/performed Overall Cognitive Status: Within Functional Limits for tasks assessed                      General Comments      Exercises        Assessment/Plan    PT Assessment Patent does not need any further PT services  PT Diagnosis     PT Problem List    PT Treatment Interventions     PT Goals (Current goals can be found in the Care Plan section) Acute Rehab PT Goals PT Goal Formulation: All assessment and education complete, DC therapy    Frequency     Barriers to discharge        Co-evaluation               End of Session   Activity Tolerance: Patient tolerated treatment well Patient left: in chair      Functional Assessment Tool Used: Clinical  judgment Functional Limitation: Mobility: Walking and moving around Mobility: Walking and Moving Around Current Status ([id number](G8978): At least 1 percent but less than 20 percent impaired, limited or restricted Mobility: Walking and Moving Around Goal Status [phone](G8979): 0 percent impaired, limited or restricted Mobility: Walking and Moving Around Discharge Status [phone](G8980): 0 percent impaired, limited or restricted    Time: [id number] PT Time Calculation (min) (ACUTE ONLY): 10 min   Charges:   PT Evaluation $Initial PT Evaluation Tier I: 1 Procedure     PT G Codes:   PT G-Codes **NOT FOR INPATIENT CLASS** Functional Assessment Tool Used: Clinical judgment Functional Limitation: Mobility: Walking and moving around Mobility: Walking and Moving Around Current Status ([id number](G8978): At least 1 percent but less than 20 percent impaired, limited or restricted Mobility: Walking and Moving Around Goal Status [phone](G8979): 0 percent impaired, limited or restricted Mobility: Walking and Moving Around Discharge Status [phone](G8980): 0 percent impaired, limited or restricted    Alvie HeidelbergFolan, Adele Milson A 06/28/2014, 10:08 AM Alvie HeidelbergShauna Folan, PT, DPT [phone]

## 2014-06-28 NOTE — Evaluation (Signed)
SLP Cancellation Note  Patient Details Name: Jefferey PicaStephen R Fosse MRN: [id number] DOB: [birth year]   Cancelled treatment:       Reason Eval/Treat Not Completed: SLP screened, no needs identified, will sign off  Donavan Burnetamara Axtyn Woehler, [place] [facility] SLP [phone]

## 2014-06-28 NOTE — Progress Notes (Signed)
STROKE TEAM PROGRESS NOTE   HISTORY Christian Wilcox is an 59 y.o. male with no documented previous medical history presenting with new onset numbness and distal weakness of his left upper extremity. He was last known well at 1345 today 06/27/2014. He has not been on antiplatelet therapy. CT scan of his head showed no acute intracranial abnormality. Strength of his left upper extremity improved. Numbness has persisted. No changes in speech were noted. He had no weakness nor sensory changes involving left lower extremity. NIH stroke score was 2. Patient was not administered TPA secondary to improving symptoms with minimal residual deficits. He was admitted  for further evaluation and treatment.   SUBJECTIVE (INTERVAL HISTORY) No family is at the bedside.  Overall he feels his condition is rapidly improving.    OBJECTIVE Temp:  [97.9 F (36.6 C)-98.8 F (37.1 C)] 98.5 F (36.9 C) (01/28 0959) Pulse Rate:  [60-80] 77 (01/28 0959) Cardiac Rhythm:  [-] Normal sinus rhythm (01/28 0800) Resp:  [11-28] 14 (01/28 0959) BP: (90-142)/(47-93) 106/71 mmHg (01/28 0959) SpO2:  [92 %-100 %] 97 % (01/28 0959) Weight:  [85.276 kg (188 lb)] 85.276 kg (188 lb) (01/27 1650)   Recent Labs Lab 06/27/14 1424 06/27/14 1649 06/28/14 0653  GLUCAP 128* 105* 102*    Recent Labs Lab 06/27/14 1425 06/27/14 1453  NA 138 140  K 3.8 3.8  CL 105 105  CO2 24  --   GLUCOSE 120* 120*  BUN 15 15  CREATININE 1.33 1.30  CALCIUM 8.8  --     Recent Labs Lab 06/27/14 1425  AST 15  ALT 13  ALKPHOS 71  BILITOT 0.6  PROT 7.4  ALBUMIN 4.1    Recent Labs Lab 06/27/14 1425 06/27/14 1453  WBC 8.3  --   NEUTROABS 5.2  --   HGB 14.7 16.0  HCT 42.3 47.0  MCV 95.3  --   PLT 293  --    No results for input(s): CKTOTAL, CKMB, CKMBINDEX, TROPONINI in the last 168 hours.  Recent Labs  06/27/14 1425  LABPROT 13.2  INR 0.99   No results for input(s): COLORURINE, LABSPEC, PHURINE, GLUCOSEU, HGBUR,  BILIRUBINUR, KETONESUR, PROTEINUR, UROBILINOGEN, NITRITE, LEUKOCYTESUR in the last 72 hours.  Invalid input(s): APPERANCEUR  No results found for: CHOL, TRIG, HDL, CHOLHDL, VLDL, LDLCALC No results found for: HGBA1C No results found for: LABOPIA, COCAINSCRNUR, LABBENZ, AMPHETMU, THCU, LABBARB  No results for input(s): ETH in the last 168 hours.  Dg Chest 2 View  06/27/2014   CLINICAL DATA:  Stroke, dizziness, left arm numbness  EXAM: CHEST  2 VIEW  COMPARISON:  08/31/2004  FINDINGS: Cardiomediastinal silhouette is stable. No acute infiltrate or pleural effusion. No pulmonary edema. Mild degenerative changes mid and lower thoracic spine.  IMPRESSION: No active cardiopulmonary disease.   Electronically Signed   By: Natasha Mead M.D.   On: 06/27/2014 16:54   Ct Head (brain) Wo Contrast  06/27/2014   CLINICAL DATA:  Left arm numbness, frontal headache, blurred position  EXAM: CT HEAD WITHOUT CONTRAST  TECHNIQUE: Contiguous axial images were obtained from the base of the skull through the vertex without intravenous contrast.  COMPARISON:  None.  FINDINGS: No skull fracture is noted. There is mucosal thickening bilateral maxillary sinus. The mastoid air cells are unremarkable. Mild mucosal thickening in posterior aspect of left sphenoid sinus.  No intracranial hemorrhage, mass effect or midline shift  No acute infarction. No mass lesion is noted on this unenhanced scan no hydrocephalus.  IMPRESSION: No acute intracranial abnormality. Mucosal thickening bilateral maxillary sinus.   Electronically Signed   By: Natasha MeadLiviu  Pop M.D.   On: 06/27/2014 14:56   Mr Brain Wo Contrast  06/27/2014   CLINICAL DATA:  Sudden onset of left upper extremity weakness, left hand tingling, and numbness. Transient episode of blurred vision.  EXAM: MRI HEAD WITHOUT CONTRAST  MRA HEAD WITHOUT CONTRAST  TECHNIQUE: Multiplanar, multiecho pulse sequences of the brain and surrounding structures were obtained without intravenous contrast.  Angiographic images of the head were obtained using MRA technique without contrast.  COMPARISON:  CT head without contrast from the same day.  FINDINGS: MRI HEAD FINDINGS  The diffusion-weighted images demonstrate no evidence for acute or subacute infarction. Mild periventricular and subcortical white matter changes are slightly advanced for age. No acute hemorrhage or mass lesion is present. The ventricles are of normal size. No significant extraaxial fluid collection is present.  Flow is present in the major intracranial arteries. The globes and orbits are intact.  Fluid levels are present in the maxillary sinuses bilaterally with circumferential mucosal thickening. Mild mucosal thickening is present throughout the ethmoid air cells and right frontal sinuses. The sphenoid sinuses and mastoid air cells are clear.  MRA HEAD FINDINGS  The internal carotid arteries are within normal limits from the high cervical segments through the ICA termini bilaterally. The A1 and M1 segments are normal. The anterior communicating artery is patent. Tip there is mild distal small vessel attenuation more prominent left than right. No significant proximal stenosis, aneurysm, or branch vessel occlusion is present.  The vertebral arteries are codominant. The basilar artery is within normal limits. The left posterior cerebral artery originates from the basilar tip. A left posterior communicating artery is present as well. The right posterior cerebral artery is of fetal type. PCA branch vessels are mildly attenuated on the left.  IMPRESSION: 1. Mild periventricular and subcortical white matter changes bilaterally are slightly advanced for age. This is nonspecific, but likely reflects the sequela of chronic microvascular ischemia. 2. Mild distal small vessel disease is evident on the MRA, more prominently in the left MCA and left PCA distributions. 3. No acute intracranial abnormality.   Electronically Signed   By: Gennette Pachris  Mattern M.D.    On: 06/27/2014 18:10   Mr Maxine GlennMra Head/brain Wo Cm  06/27/2014   CLINICAL DATA:  Sudden onset of left upper extremity weakness, left hand tingling, and numbness. Transient episode of blurred vision.  EXAM: MRI HEAD WITHOUT CONTRAST  MRA HEAD WITHOUT CONTRAST  TECHNIQUE: Multiplanar, multiecho pulse sequences of the brain and surrounding structures were obtained without intravenous contrast. Angiographic images of the head were obtained using MRA technique without contrast.  COMPARISON:  CT head without contrast from the same day.  FINDINGS: MRI HEAD FINDINGS  The diffusion-weighted images demonstrate no evidence for acute or subacute infarction. Mild periventricular and subcortical white matter changes are slightly advanced for age. No acute hemorrhage or mass lesion is present. The ventricles are of normal size. No significant extraaxial fluid collection is present.  Flow is present in the major intracranial arteries. The globes and orbits are intact.  Fluid levels are present in the maxillary sinuses bilaterally with circumferential mucosal thickening. Mild mucosal thickening is present throughout the ethmoid air cells and right frontal sinuses. The sphenoid sinuses and mastoid air cells are clear.  MRA HEAD FINDINGS  The internal carotid arteries are within normal limits from the high cervical segments through the ICA termini bilaterally. The A1  and M1 segments are normal. The anterior communicating artery is patent. Tip there is mild distal small vessel attenuation more prominent left than right. No significant proximal stenosis, aneurysm, or branch vessel occlusion is present.  The vertebral arteries are codominant. The basilar artery is within normal limits. The left posterior cerebral artery originates from the basilar tip. A left posterior communicating artery is present as well. The right posterior cerebral artery is of fetal type. PCA branch vessels are mildly attenuated on the left.  IMPRESSION: 1. Mild  periventricular and subcortical white matter changes bilaterally are slightly advanced for age. This is nonspecific, but likely reflects the sequela of chronic microvascular ischemia. 2. Mild distal small vessel disease is evident on the MRA, more prominently in the left MCA and left PCA distributions. 3. No acute intracranial abnormality.   Electronically Signed   By: Gennette Pac M.D.   On: 06/27/2014 18:10   Carotid Doppler  There is 1-39% bilateral ICA stenosis. Vertebral artery flow is antegrade.     PHYSICAL EXAM Pleasant middle-age Caucasian male not in distress.Awake alert. Afebrile. Head is nontraumatic. Neck is supple without bruit. Hearing is normal. Cardiac exam no murmur or gallop. Lungs are clear to auscultation. Distal pulses are well felt. Neurological Exam ;  Awake  Alert oriented x 3. Normal speech and language.eye movements full without nystagmus.fundi were not visualized. Vision acuity and fields appear normal. Hearing is normal. Palatal movements are normal. Face symmetric. Tongue midline. Normal strength, tone, reflexes and coordination. Normal sensation. Gait deferred.  ASSESSMENT/PLAN Christian Wilcox is a 59 y.o. 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 He did not receive IV t-PA due to Improving symptoms with minimal residual deficits.   R brain TIA  Resultant  Neuro deficits resolved  MRI  No acute stroke. small vessel disease   MRA  Mild intracranial atherosclerosis  Carotid Doppler  No significant stenosis   2D Echo  pending   LDL pending   HgbA1c pending  Lovenox 40 mg sq daily for VTE prophylaxis  Diet Heart thin liquids  no antithrombotic prior to admission, now on aspirin 325 mg orally every day  Patient counseled to be compliant with his  antithrombotic medications  Ongoing aggressive stroke risk factor management  Therapy recommendations:  No PT  Disposition:  Return home  Other Stroke Risk Factors  Cigarette smoker, advised to stop smoking. Had quit 20 years ago and just started back recently  ETOH use  Hospital day # 1  [facility] See Amion for Pager information 06/28/2014 12:01 PM  I have personally examined this patient, reviewed notes, independently viewed imaging studies, participated in medical decision making and plan of care. I have made any additions or clarifications directly to the above note. Agree with note above. He has had a right brain TIA with resolution of his deficits and normal MRI scan of the brain. he remains at [facility] for recurrent strokes and TIAs and hence continue ongoing stroke evaluation. Antiplatelet therapy.  Delia Heady, MD Medical Director [facility] Pager: [phone] 06/28/2014 8:57 PM    To contact Stroke Continuity provider, please refer to [email or web address]. After hours, contact General Neurology

## 2014-06-28 NOTE — Progress Notes (Signed)
UR completed 

## 2014-06-28 NOTE — Progress Notes (Signed)
Pt requested IV to be taken out due to discomfort. Refuses to let RN place a new IV. Pt is aware of risk for not having an IV for an emergent situation. On call MD was made aware.

## 2014-06-29 DIAGNOSIS — I519 Heart disease, unspecified: Secondary | ICD-10-CM | POA: Diagnosis present

## 2014-06-29 DIAGNOSIS — G458 Other transient cerebral ischemic attacks and related syndromes: Secondary | ICD-10-CM

## 2014-06-29 DIAGNOSIS — R200 Anesthesia of skin: Secondary | ICD-10-CM

## 2014-06-29 DIAGNOSIS — G459 Transient cerebral ischemic attack, unspecified: Secondary | ICD-10-CM

## 2014-06-29 LAB — HEMOGLOBIN A1C
HEMOGLOBIN A1C: 5.8 % — AB (ref 4.8–5.6)
Mean Plasma Glucose: 120 mg/dL

## 2014-06-29 LAB — GLUCOSE, CAPILLARY
GLUCOSE-CAPILLARY: 102 mg/dL — AB (ref 70–99)
Glucose-Capillary: 100 mg/dL — ABNORMAL HIGH (ref 70–99)

## 2014-06-29 LAB — RPR: RPR Ser Ql: NONREACTIVE

## 2014-06-29 MED ORDER — ZOLPIDEM TARTRATE ER 6.25 MG PO TBCR: 6.2500 mg | EXTENDED_RELEASE_TABLET | Freq: Every evening | ORAL | Status: AC | PRN

## 2014-06-29 MED ORDER — ASPIRIN EC 81 MG PO TBEC: 81.0000 mg | DELAYED_RELEASE_TABLET | Freq: Every day | ORAL | Status: AC

## 2014-06-29 NOTE — Progress Notes (Signed)
  Echocardiogram 2D Echocardiogram has been performed.  Arvil ChacoFoster, Haddie Bruhl 06/29/2014, 11:04 AM

## 2014-06-29 NOTE — Progress Notes (Signed)
STROKE TEAM PROGRESS NOTE   HISTORY Christian Wilcox is an 59 y.o. male with no documented previous medical history presenting with new onset numbness and distal weakness of his left upper extremity. He was last known well at 1345 today 06/27/2014. He has not been on antiplatelet therapy. CT scan of his head showed no acute intracranial abnormality. Strength of his left upper extremity improved. Numbness has persisted. No changes in speech were noted. He had no weakness nor sensory changes involving left lower extremity. NIH stroke score was 2. Patient was not administered TPA secondary to improving symptoms with minimal residual deficits. He was admitted  for further evaluation and treatment.   SUBJECTIVE (INTERVAL HISTORY) The patient's girlfriend was at the bedside. The patient voiced no complaints.   OBJECTIVE Temp:  [97 F (36.1 C)-98.7 F (37.1 C)] 97 F (36.1 C) (01/29 0937) Pulse Rate:  [59-91] 71 (01/29 0937) Cardiac Rhythm:  [-] Normal sinus rhythm (01/29 0855) Resp:  [14-20] 20 (01/29 0937) BP: (106-122)/(61-74) 106/74 mmHg (01/29 0937) SpO2:  [96 %-98 %] 96 % (01/29 0937)   Recent Labs Lab 06/27/14 1424 06/27/14 1649 06/28/14 0653 06/28/14 1120 06/28/14 1620  GLUCAP 128* 105* 102* 102* 100*    Recent Labs Lab 06/27/14 1425 06/27/14 1453  NA 138 140  K 3.8 3.8  CL 105 105  CO2 24  --   GLUCOSE 120* 120*  BUN 15 15  CREATININE 1.33 1.30  CALCIUM 8.8  --     Recent Labs Lab 06/27/14 1425  AST 15  ALT 13  ALKPHOS 71  BILITOT 0.6  PROT 7.4  ALBUMIN 4.1    Recent Labs Lab 06/27/14 1425 06/27/14 1453  WBC 8.3  --   NEUTROABS 5.2  --   HGB 14.7 16.0  HCT 42.3 47.0  MCV 95.3  --   PLT 293  --    No results for input(s): CKTOTAL, CKMB, CKMBINDEX, TROPONINI in the last 168 hours.  Recent Labs  06/27/14 1425  LABPROT 13.2  INR 0.99    Recent Labs  06/28/14 1637  COLORURINE YELLOW  LABSPEC 1.022  PHURINE 5.5  GLUCOSEU NEGATIVE  HGBUR  NEGATIVE  BILIRUBINUR NEGATIVE  KETONESUR NEGATIVE  PROTEINUR NEGATIVE  UROBILINOGEN 0.2  NITRITE NEGATIVE  LEUKOCYTESUR NEGATIVE       Component Value Date/Time   CHOL 209* 06/28/2014 1210   TRIG 252* 06/28/2014 1210   HDL 30* 06/28/2014 1210   CHOLHDL 7.0 06/28/2014 1210   VLDL 50* 06/28/2014 1210   LDLCALC 129* 06/28/2014 1210   Lab Results  Component Value Date   HGBA1C 5.8* 06/28/2014   No results found for: LABOPIA, COCAINSCRNUR, LABBENZ, AMPHETMU, THCU, LABBARB  No results for input(s): ETH in the last 168 hours.    2-D echocardiogram 06/29/2014 Study Conclusions - Left ventricle: The cavity size was normal. There was mild concentric hypertrophy. Systolic function was normal. Wall motion was normal; there were no regional wall motion abnormalities. Doppler parameters are consistent with abnormal left ventricular relaxation (grade 1 diastolic dysfunction). - Aortic valve: There was no regurgitation. - Aortic root: The aortic root was normal in size. - Mitral valve: Structurally normal valve. - Left atrium: The atrium was mildly dilated. - Right ventricle: Systolic function was normal. - Right atrium: The atrium was normal in size. - Tricuspid valve: There was mild regurgitation. - Pulmonic valve: There was no regurgitation. - Pulmonary arteries: Systolic pressure was within the normal range. - Inferior vena cava: The vessel was normal  in size. - Pericardium, extracardiac: There was no pericardial effusion.  Dg Chest 2 View 06/27/2014    No active cardiopulmonary disease.      Mr Brain Wo Contrast 06/27/2014    1. Mild periventricular and subcortical white matter changes bilaterally are slightly advanced for age. This is nonspecific, but likely reflects the sequela of chronic microvascular ischemia.  2. Mild distal small vessel disease is evident on the MRA, more prominently in the left MCA and left PCA distributions.  3. No acute intracranial  abnormality.    Mr Maxine GlennMra Head/brain Wo Cm 06/27/2014    1. Mild periventricular and subcortical white matter changes bilaterally are slightly advanced for age. This is nonspecific, but likely reflects the sequela of chronic microvascular ischemia.  2. Mild distal small vessel disease is evident on the MRA, more prominently in the left MCA and left PCA distributions.  3. No acute intracranial abnormality.    Carotid Doppler  There is 1-39% bilateral ICA stenosis. Vertebral artery flow is antegrade.     PHYSICAL EXAM Pleasant middle-age Caucasian male not in distress.Awake alert. Afebrile. Head is nontraumatic. Neck is supple without bruit. Hearing is normal. Cardiac exam no murmur or gallop. Lungs are clear to auscultation. Distal pulses are well felt. Neurological Exam ;  Awake  Alert oriented x 3. Normal speech and language.eye movements full without nystagmus.fundi were not visualized. Vision acuity and fields appear normal. Hearing is normal. Palatal movements are normal. Face symmetric. Tongue midline. Normal strength, tone, reflexes and coordination. Normal sensation. Gait deferred.  ASSESSMENT/PLAN [name] is a 59 y.o. 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 He did not receive IV t-PA due to Improving symptoms with minimal residual deficits.   R brain TIA  Resultant  Neuro deficits resolved  MRI  No acute stroke. small vessel disease   MRA  Mild intracranial atherosclerosis  Carotid Doppler  No significant stenosis   2D Echo  as above. No cardiac source of emboli identified.  LDL 129 ( the patient was discharged this a.m.)  HgbA1c 5.8  Lovenox 40 mg sq daily for VTE prophylaxis Diet Heart  Diet - low sodium heart healthy thin liquids  no antithrombotic prior to  admission, now on aspirin 325 mg orally every day  Patient counseled to be compliant with his antithrombotic medications  Ongoing aggressive stroke risk factor management  Therapy recommendations:  No PT  Disposition:  Return home  Other Stroke Risk Factors  Cigarette smoker, advised to stop smoking. Had quit 20 years ago and just started back recently  ETOH use  Hospital day # 2  RINEHULS, DAVID Wilfred LacyL  [facility] See Amion for Pager information 06/29/2014 3:37 PM  I have personally examined this patient, reviewed notes, independently viewed imaging studies, participated in medical decision making and plan of care. I have made any additions or clarifications directly to the above note. Agree with note above.   Delia HeadyPramod Jordann Grime, MD Medical Director [facility] Pager: [phone] 06/29/2014 4:48 PM    To contact Stroke Continuity provider, please refer to [email or web address]. After hours, contact General Neurology.[place]

## 2014-06-29 NOTE — Progress Notes (Signed)
Patient discharged to his home with wife. Discharge instructions and medications reviewed with patient. Patient states he understands both. Letter for work given to patient.

## 2014-06-29 NOTE — Discharge Instructions (Signed)
STROKE/TIA DISCHARGE INSTRUCTIONS SMOKING Cigarette smoking nearly doubles your risk of having a stroke & is the single most alterable risk factor  If you smoke or have smoked in the last 12 months, you are advised to quit smoking for your health.  Most of the excess cardiovascular risk related to smoking disappears within a year of stopping.  Ask you doctor about anti-smoking medications  [place] Quit Line: 1-800-QUIT NOW  Free Smoking Cessation Classes [phone]  CHOLESTEROL Know your levels; limit fat & cholesterol in your diet  Lipid Panel     Component Value Date/Time   CHOL 209* 06/28/2014 1210   TRIG 252* 06/28/2014 1210   HDL 30* 06/28/2014 1210   CHOLHDL 7.0 06/28/2014 1210   VLDL 50* 06/28/2014 1210   LDLCALC 129* 06/28/2014 1210      Many patients benefit from treatment even if their cholesterol is at goal.  Goal: Total Cholesterol (CHOL) less than 160  Goal:  Triglycerides (TRIG) less than 150  Goal:  HDL greater than 40  Goal:  LDL (LDLCALC) less than 100   BLOOD PRESSURE American Stroke Association blood pressure target is less that 120/80 mm/Hg  Your discharge blood pressure is:  BP: 106/74 mmHg  Monitor your blood pressure  Limit your salt and alcohol intake  Many individuals will require more than one medication for high blood pressure  DIABETES (A1c is a blood sugar average for last 3 months) Goal HGBA1c is under 7% (HBGA1c is blood sugar average for last 3 months)  Diabetes: No known diagnosis of diabetes    Lab Results  Component Value Date   HGBA1C 5.8* 06/28/2014     Your HGBA1c can be lowered with medications, healthy diet, and exercise.  Check your blood sugar as directed by your physician  Call your physician if you experience unexplained or low blood sugars.  PHYSICAL ACTIVITY/REHABILITATION Goal is 30 minutes at least 4 days per week  Activity: Increase activity slowly, and Walk with assistance,  Activity decreases your risk of heart  attack and stroke and makes your heart stronger.  It helps control your weight and blood pressure; helps you relax and can improve your mood.  Participate in a regular exercise program.  Talk with your doctor about the best form of exercise for you (dancing, walking, swimming, cycling).  DIET/WEIGHT Goal is to maintain a healthy weight  Your discharge diet is: Diet Heart Diet - low sodium heart healthy, thin liquids Your height is:  Height:  (180.3 cm) Your current weight is: Weight: 85.276 kg (188 lb) Your Body Mass Index (BMI) is:  BMI (Calculated): 26.3  Following the type of diet specifically designed for you will help prevent another stroke.  Your goal Body Mass Index (BMI) is 19-24.  Healthy food habits can help reduce 3 risk factors for stroke:  High cholesterol, hypertension, and excess weight.  RESOURCES Stroke/Support Group:  Call [phone]   STROKE EDUCATION PROVIDED/REVIEWED AND GIVEN TO PATIENT Stroke warning signs and symptoms How to activate emergency medical system (call 911). Medications prescribed at discharge. Need for follow-up after discharge. Personal risk factors for stroke. Pneumonia vaccine given: No Flu vaccine given: No My questions have been answered, the writing is legible, and I understand these instructions.  I will adhere to these goals & educational materials that have been provided to me after my discharge from the hospital.   Aspirin, ASA oral tablets What is this medicine? ASPIRIN (AS pir in) is a pain reliever. It is  used to treat mild pain and fever. This medicine is also used as directed by a doctor to prevent and to treat heart attacks, to prevent strokes, and to treat arthritis or inflammation. This medicine may be used for other purposes; ask your health care provider or pharmacist if you have questions. COMMON BRAND NAME(S): Aspir-Low, Aspir-Trin, Aspirtab, Bayer Advanced Aspirin, Bayer Aspirin, Bayer Aspirin Extra Strength, Bayer  Aspirin Plus, Surveyor, quantity, Surveyor, quantity Plus, Bayer Genuine Aspirin, Bayer Womens Aspirin, Bufferin, Bufferin Extra Strength, Bufferin Low Dose What should I tell my health care provider before I take this medicine? They need to know if you have any of these conditions: -anemia -asthma -bleeding problems -child with chickenpox, the flu, or other viral infection -diabetes -gout -if you frequently drink alcohol containing drinks -kidney disease -liver disease -low level of vitamin K -lupus -smoke tobacco -stomach ulcers or other problems -an unusual or allergic reaction to aspirin, tartrazine dye, other medicines, dyes, or preservatives -pregnant or trying to get pregnant -breast-feeding How should I use this medicine? Take this medicine by mouth with a glass of water. Follow the directions on the package or prescription label. You can take this medicine with or without food. If it upsets your stomach, take it with food. Do not take your medicine more often than directed. Talk to your pediatrician regarding the use of this medicine in children. While this drug may be prescribed for children as young as 16 years of age for selected conditions, precautions do apply. Children and teenagers should not use this medicine to treat chicken pox or flu symptoms unless directed by a doctor. Patients over 75 years old may have a stronger reaction and need a smaller dose. Overdosage: If you think you have taken too much of this medicine contact a poison control center or emergency room at once. NOTE: This medicine is only for you. Do not share this medicine with others. What if I miss a dose? If you are taking this medicine on a regular schedule and miss a dose, take it as soon as you can. If it is almost time for your next dose, take only that dose. Do not take double or extra doses. What may interact with this medicine? Do not take this medicine with any of the following  medications: -cidofovir -ketorolac -probenecid This medicine may also interact with the following medications: -alcohol -alendronate -bismuth subsalicylate -flavocoxid -herbal supplements like feverfew, garlic, ginger, ginkgo biloba, horse chestnut -medicines for diabetes or glaucoma like acetazolamide, methazolamide -medicines for gout -medicines that treat or prevent blood clots like enoxaparin, heparin, ticlopidine, warfarin -other aspirin and aspirin-like medicines -NSAIDs, medicines for pain and inflammation, like ibuprofen or naproxen -pemetrexed -sulfinpyrazone -varicella live vaccine This list may not describe all possible interactions. Give your health care provider a list of all the medicines, herbs, non-prescription drugs, or dietary supplements you use. Also tell them if you smoke, drink alcohol, or use illegal drugs. Some items may interact with your medicine. What should I watch for while using this medicine? If you are treating yourself for pain, tell your doctor or health care professional if the pain lasts more than 10 days, if it gets worse, or if there is a new or different kind of pain. Tell your doctor if you see redness or swelling. Also, check with your doctor if you have a fever that lasts for more than 3 days. Only take this medicine to prevent heart attacks or blood clotting if prescribed by your doctor  or health care professional. Do not take aspirin or aspirin-like medicines with this medicine. Too much aspirin can be dangerous. Always read the labels carefully. This medicine can irritate your stomach or cause bleeding problems. Do not smoke cigarettes or drink alcohol while taking this medicine. Do not lie down for 30 minutes after taking this medicine to prevent irritation to your throat. If you are scheduled for any medical or dental procedure, tell your healthcare provider that you are taking this medicine. You may need to stop taking this medicine before the  procedure. This medicine may be used to treat migraines. If you take migraine medicines for 10 or more days a month, your migraines may get worse. Keep a diary of headache days and medicine use. Contact your healthcare professional if your migraine attacks occur more frequently. What side effects may I notice from receiving this medicine? Side effects that you should report to your doctor or health care professional as soon as possible: -allergic reactions like skin rash, itching or hives, swelling of the face, lips, or tongue -breathing problems -changes in hearing, ringing in the ears -confusion -general ill feeling or flu-like symptoms -pain on swallowing -redness, blistering, peeling or loosening of the skin, including inside the mouth or nose -signs and symptoms of bleeding such as bloody or black, tarry stools; red or dark-brown urine; spitting up blood or brown material that looks like coffee grounds; red spots on the skin; unusual bruising or bleeding from the eye, gums, or nose -trouble passing urine or change in the amount of urine -unusually weak or tired -yellowing of the eyes or skin Side effects that usually do not require medical attention (report to your doctor or health care professional if they continue or are bothersome): -diarrhea or constipation -headache -nausea, vomiting -stomach gas, heartburn This list may not describe all possible side effects. Call your doctor for medical advice about side effects. You may report side effects to FDA at 1-800-FDA-1088. Where should I keep my medicine? Keep out of the reach of children. Store at room temperature between 15 and 30 degrees C (59 and 86 degrees F). Protect from heat and moisture. Do not use this medicine if it has a strong vinegar smell. Throw away any unused medicine after the expiration date. NOTE: This sheet is a summary. It may not cover all possible information. If you have questions about this medicine, talk to  your doctor, pharmacist, or health care provider.  2015, Elsevier/Gold Standard. (2013-01-17 11:30:31)

## 2014-06-29 NOTE — Discharge Summary (Signed)
Christian Wilcox, is a 59 y.o. male  DOB [birth year]  MRN [id number].  Admission date:  06/27/2014  Admitting Physician  Elease Etienne, MD  Discharge Date:  06/29/2014   Primary MD  No primary care provider on file.  Recommendations for primary care physician for things to follow:   Refer to cardiology for diastolic dysfunction, encourage patient to use statin and quit smoking.   Admission Diagnosis  Stroke [I63.9]   Discharge Diagnosis  Stroke [I63.9]    Active Problems:   Tobacco abuse   Alcohol use   CKD (chronic kidney disease), stage III   Hyperlipidemia   Mild diastolic dysfunction      History reviewed. No pertinent past medical history.  Past Surgical History  Procedure Laterality Date  . Fracture surgery Left     Forearm  . 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 Workup was significant for MRI brain showing "1. Mild periventricular and subcortical white matter changes bilaterally are slightly advanced for age. This is nonspecific, but likely reflects the sequela of chronic microvascular ischemia. 2. Mild distal small vessel disease is evident on the  MRA, more prominently in the left MCA and left PCA distributions. 3. No acute intracranial abnormality". Carotid Doppler was unremarkable and 2D Echo suggested Grade 1 diastolic dysfunction. Lipid panel suggests hyperlipidemia which patient states has been borderline in the last few years and he is not willing to take medications for it at this point. Hemoglobin A1c was 5.8. His blood pressure was occasionally low in the 90s systolic in house; I wonder if this has anything to do with his presentation. TSH/vitamin B12 level/RPR/UA were unremarkable. Patient was discharged on aspirin. He refuses to take statin at the moment. I encouraged patient to follow with his PCP and cardiology for monitoring of diastolic dysfunction and consideration for further evaluation if deemed necessary by cardiology.Christian Wilcox   Discharge Condition: Stable.   Follow UP      Discharge Instructions  and  Discharge Medications    Discharge Instructions    Diet - low sodium heart healthy    Complete by:  As directed      Increase activity slowly    Complete by:  As directed             Medication List  TAKE these medications        aspirin EC 81 MG tablet  Take 1 tablet (81 mg total) by mouth daily.     NEXIUM PO  Take 1 capsule by mouth daily as needed (heartburn).     zolpidem 6.25 MG CR tablet  Commonly known as:  AMBIEN CR  Take 1 tablet (6.25 mg total) by mouth at bedtime as needed for sleep.          Diet and Activity recommendation: See Discharge Instructions above   Consults obtained - Neurology.   Major procedures and Radiology Reports - PLEASE review detailed and final reports for all details, in brief -    Dg Chest 2 View  06/27/2014   CLINICAL DATA:  Stroke, dizziness, left arm numbness  EXAM: CHEST  2 VIEW  COMPARISON:  08/31/2004  FINDINGS: Cardiomediastinal silhouette is stable. No acute infiltrate or pleural effusion. No pulmonary edema. Mild degenerative changes mid and lower  thoracic spine.  IMPRESSION: No active cardiopulmonary disease.   Electronically Signed   By: Natasha MeadLiviu  Pop M.D.   On: 06/27/2014 16:54   Ct Head (brain) Wo Contrast  06/27/2014   CLINICAL DATA:  Left arm numbness, frontal headache, blurred position  EXAM: CT HEAD WITHOUT CONTRAST  TECHNIQUE: Contiguous axial images were obtained from the base of the skull through the vertex without intravenous contrast.  COMPARISON:  None.  FINDINGS: No skull fracture is noted. There is mucosal thickening bilateral maxillary sinus. The mastoid air cells are unremarkable. Mild mucosal thickening in posterior aspect of left sphenoid sinus.  No intracranial hemorrhage, mass effect or midline shift  No acute infarction. No mass lesion is noted on this unenhanced scan no hydrocephalus.  IMPRESSION: No acute intracranial abnormality. Mucosal thickening bilateral maxillary sinus.   Electronically Signed   By: Natasha MeadLiviu  Pop M.D.   On: 06/27/2014 14:56   Mr Brain Wo Contrast  06/27/2014   CLINICAL DATA:  Sudden onset of left upper extremity weakness, left hand tingling, and numbness. Transient episode of blurred vision.  EXAM: MRI HEAD WITHOUT CONTRAST  MRA HEAD WITHOUT CONTRAST  TECHNIQUE: Multiplanar, multiecho pulse sequences of the brain and surrounding structures were obtained without intravenous contrast. Angiographic images of the head were obtained using MRA technique without contrast.  COMPARISON:  CT head without contrast from the same day.  FINDINGS: MRI HEAD FINDINGS  The diffusion-weighted images demonstrate no evidence for acute or subacute infarction. Mild periventricular and subcortical white matter changes are slightly advanced for age. No acute hemorrhage or mass lesion is present. The ventricles are of normal size. No significant extraaxial fluid collection is present.  Flow is present in the major intracranial arteries. The globes and orbits are intact.  Fluid levels are present in the maxillary sinuses bilaterally with  circumferential mucosal thickening. Mild mucosal thickening is present throughout the ethmoid air cells and right frontal sinuses. The sphenoid sinuses and mastoid air cells are clear.  MRA HEAD FINDINGS  The internal carotid arteries are within normal limits from the high cervical segments through the ICA termini bilaterally. The A1 and M1 segments are normal. The anterior communicating artery is patent. Tip there is mild distal small vessel attenuation more prominent left than right. No significant proximal stenosis, aneurysm, or branch vessel occlusion is present.  The vertebral arteries are codominant. The basilar artery is within normal limits. The left posterior cerebral artery originates from the basilar tip. A left posterior communicating artery is present as well. The right posterior  cerebral artery is of fetal type. PCA branch vessels are mildly attenuated on the left.  IMPRESSION: 1. Mild periventricular and subcortical white matter changes bilaterally are slightly advanced for age. This is nonspecific, but likely reflects the sequela of chronic microvascular ischemia. 2. Mild distal small vessel disease is evident on the MRA, more prominently in the left MCA and left PCA distributions. 3. No acute intracranial abnormality.   Electronically Signed   By: Gennette Pac M.D.   On: 06/27/2014 18:10   Mr Maxine Glenn Head/brain Wo Cm  06/27/2014   CLINICAL DATA:  Sudden onset of left upper extremity weakness, left hand tingling, and numbness. Transient episode of blurred vision.  EXAM: MRI HEAD WITHOUT CONTRAST  MRA HEAD WITHOUT CONTRAST  TECHNIQUE: Multiplanar, multiecho pulse sequences of the brain and surrounding structures were obtained without intravenous contrast. Angiographic images of the head were obtained using MRA technique without contrast.  COMPARISON:  CT head without contrast from the same day.  FINDINGS: MRI HEAD FINDINGS  The diffusion-weighted images demonstrate no evidence for acute or subacute  infarction. Mild periventricular and subcortical white matter changes are slightly advanced for age. No acute hemorrhage or mass lesion is present. The ventricles are of normal size. No significant extraaxial fluid collection is present.  Flow is present in the major intracranial arteries. The globes and orbits are intact.  Fluid levels are present in the maxillary sinuses bilaterally with circumferential mucosal thickening. Mild mucosal thickening is present throughout the ethmoid air cells and right frontal sinuses. The sphenoid sinuses and mastoid air cells are clear.  MRA HEAD FINDINGS  The internal carotid arteries are within normal limits from the high cervical segments through the ICA termini bilaterally. The A1 and M1 segments are normal. The anterior communicating artery is patent. Tip there is mild distal small vessel attenuation more prominent left than right. No significant proximal stenosis, aneurysm, or branch vessel occlusion is present.  The vertebral arteries are codominant. The basilar artery is within normal limits. The left posterior cerebral artery originates from the basilar tip. A left posterior communicating artery is present as well. The right posterior cerebral artery is of fetal type. PCA branch vessels are mildly attenuated on the left.  IMPRESSION: 1. Mild periventricular and subcortical white matter changes bilaterally are slightly advanced for age. This is nonspecific, but likely reflects the sequela of chronic microvascular ischemia. 2. Mild distal small vessel disease is evident on the MRA, more prominently in the left MCA and left PCA distributions. 3. No acute intracranial abnormality.   Electronically Signed   By: Gennette Pac M.D.   On: 06/27/2014 18:10    Micro Results    No results found for this or any previous visit (from the past 240 hour(s)).     Today   Subjective:   Roxy Filler today has no headache,no chest abdominal pain,no new weakness tingling or  numbness, feels much better wants to go home today.   Objective:   Blood pressure 106/74, pulse 71, temperature 97 F (36.1 C), temperature source Oral, resp. rate 20, height  (1.803 m), weight 85.276 kg (188 lb), SpO2 96 %.   Intake/Output Summary (Last 24 hours) at 06/29/14 1917 Last data filed at 06/29/14 0938  Gross per 24 hour  Intake    480 ml  Output      0 ml  Net    480 ml    Exam Awake Alert, Oriented x 3, No new F.N deficits, Normal affect [place].AT,PERRAL Supple  Neck,No JVD, No cervical lymphadenopathy appriciated.  Symmetrical Chest wall movement, Good air movement bilaterally, CTAB RRR,No Gallops,Rubs or new Murmurs, No Parasternal Heave +ve B.Sounds, Abd Soft, Non tender, No organomegaly appriciated, No rebound -guarding or rigidity. No Cyanosis, Clubbing or edema, No new Rash or bruise  Data Review   CBC w Diff: Lab Results  Component Value Date   WBC 8.3 06/27/2014   HGB 16.0 06/27/2014   HCT 47.0 06/27/2014   PLT 293 06/27/2014   LYMPHOPCT 26 06/27/2014   MONOPCT 9 06/27/2014   EOSPCT 2 06/27/2014   BASOPCT 1 06/27/2014    CMP: Lab Results  Component Value Date   NA 140 06/27/2014   K 3.8 06/27/2014   CL 105 06/27/2014   CO2 24 06/27/2014   BUN 15 06/27/2014   CREATININE 1.30 06/27/2014   PROT 7.4 06/27/2014   ALBUMIN 4.1 06/27/2014   BILITOT 0.6 06/27/2014   ALKPHOS 71 06/27/2014   AST 15 06/27/2014   ALT 13 06/27/2014  .   Total Time in preparing paper work, data evaluation and todays exam - 35 minutes  Sheala Dosh M.D on 06/29/2014 at 7:17 PM  [facility] Office  [phone]
